# Patient Record
Sex: Male | Born: 1995
Health system: Southern US, Community
[De-identification: ages and names within clinical notes are randomized; demographics above are authoritative.]

## PROBLEM LIST (undated history)

## (undated) DIAGNOSIS — R569 Unspecified convulsions: Secondary | ICD-10-CM

## (undated) HISTORY — DX: Unspecified convulsions: R56.9

---
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
## 2016-07-24 DIAGNOSIS — G40909 Epilepsy, unspecified, not intractable, without status epilepticus: Secondary | ICD-10-CM

## 2016-07-24 HISTORY — DX: Epilepsy, unspecified, not intractable, without status epilepticus: G40.909

## 2017-01-21 ENCOUNTER — Emergency Department (HOSPITAL_COMMUNITY)
Admission: EM | Admit: 2017-01-21 | Discharge: 2017-01-21 | Disposition: A | Discharge status: Home / Self Care | Payer: BLUE CROSS/BLUE SHIELD | Attending: Emergency Medicine | Admitting: Emergency Medicine

## 2017-01-21 ENCOUNTER — Encounter (HOSPITAL_COMMUNITY): Payer: Self-pay | Admitting: Pharmacy Technician

## 2017-01-21 ENCOUNTER — Emergency Department (HOSPITAL_COMMUNITY): Payer: BLUE CROSS/BLUE SHIELD

## 2017-01-21 DIAGNOSIS — Y999 Unspecified external cause status: Secondary | ICD-10-CM | POA: Diagnosis not present

## 2017-01-21 DIAGNOSIS — S0031XA Abrasion of nose, initial encounter: Secondary | ICD-10-CM | POA: Diagnosis not present

## 2017-01-21 DIAGNOSIS — Y9289 Other specified places as the place of occurrence of the external cause: Secondary | ICD-10-CM | POA: Diagnosis not present

## 2017-01-21 DIAGNOSIS — S50312A Abrasion of left elbow, initial encounter: Secondary | ICD-10-CM | POA: Insufficient documentation

## 2017-01-21 DIAGNOSIS — R569 Unspecified convulsions: Secondary | ICD-10-CM | POA: Diagnosis not present

## 2017-01-21 DIAGNOSIS — W01198A Fall on same level from slipping, tripping and stumbling with subsequent striking against other object, initial encounter: Secondary | ICD-10-CM | POA: Insufficient documentation

## 2017-01-21 DIAGNOSIS — Y93G2 Activity, grilling and smoking food: Secondary | ICD-10-CM | POA: Insufficient documentation

## 2017-01-21 LAB — CBC WITH DIFFERENTIAL/PLATELET
BASOS ABS: 0 10*3/uL (ref 0.0–0.1)
Basophils Relative: 0 %
EOS PCT: 0 %
Eosinophils Absolute: 0 10*3/uL (ref 0.0–0.7)
HCT: 39.1 % (ref 39.0–52.0)
Hemoglobin: 13.7 g/dL (ref 13.0–17.0)
LYMPHS PCT: 9 %
Lymphs Abs: 1 10*3/uL (ref 0.7–4.0)
MCH: 31 pg (ref 26.0–34.0)
MCHC: 35 g/dL (ref 30.0–36.0)
MCV: 88.5 fL (ref 78.0–100.0)
MONO ABS: 0.8 10*3/uL (ref 0.1–1.0)
Monocytes Relative: 7 %
Neutro Abs: 9.9 10*3/uL — ABNORMAL HIGH (ref 1.7–7.7)
Neutrophils Relative %: 84 %
PLATELETS: 220 10*3/uL (ref 150–400)
RBC: 4.42 MIL/uL (ref 4.22–5.81)
RDW: 12.1 % (ref 11.5–15.5)
WBC: 11.7 10*3/uL — ABNORMAL HIGH (ref 4.0–10.5)

## 2017-01-21 LAB — BASIC METABOLIC PANEL
Anion gap: 6 (ref 5–15)
BUN: 18 mg/dL (ref 6–20)
CALCIUM: 8.8 mg/dL — AB (ref 8.9–10.3)
CO2: 26 mmol/L (ref 22–32)
CREATININE: 1.06 mg/dL (ref 0.61–1.24)
Chloride: 106 mmol/L (ref 101–111)
GFR calc Af Amer: >60 mL/min (ref >60)
GLUCOSE: 117 mg/dL — AB (ref 65–99)
POTASSIUM: 3.7 mmol/L (ref 3.5–5.1)
Sodium: 138 mmol/L (ref 135–145)

## 2017-01-21 LAB — CBG MONITORING, ED: GLUCOSE-CAPILLARY: 107 mg/dL — AB (ref 65–99)

## 2017-01-21 MED ORDER — LORAZEPAM 2 MG/ML IJ SOLN
1.0000 mg | Freq: Once | INTRAMUSCULAR | Status: DC | PRN
  Filled 2017-01-21: qty 1

## 2017-01-21 NOTE — ED Notes (Signed)
Family at bedside. 

## 2017-01-21 NOTE — ED Notes (Signed)
Pt with 1 episode of emesis 

## 2017-01-21 NOTE — ED Triage Notes (Signed)
Pt arrives via EMS with reports of grand mal seizure, witnessed, lasting approx 2 minutes. Pt fell from a standing position and hit his nose on a grill. Small abrasion noted to bridge of nose. Pt a&oX4 on arrival. Pt given 4mg  odt zofran and 400cc NS en route. Pt with hx of 1 seizure approx 8 years ago. Pt arrives in [place].

## 2017-01-21 NOTE — ED Provider Notes (Signed)
[facility]-EMERGENCY DEPT Provider Note   CSN: [id number] Arrival date & time: 01/21/17  1531     History   Chief Complaint Chief Complaint  Patient presents with  . Seizures    HPI Matthew Barr is a 21 y.o. 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 Bystanders reported that he suddenly dropped and hit his head on the grill fell to the grown and was tightening his extremities. Family reports that it took a while for him to come back completely to it and he was pale. He had an episode of vomiting. They report confusion for a while after the episode. Patient states that she has had a seizure about 8 years ago and workup was negative. At the time he was staring at a flashing screen 1 inch from his eyes and suddenly went into a seizure tongue biting which was witnessed. Aunt in the room reports a family history of early sudden death in great-grandfather who died suddenly after returning from [employer] and was in his 15s. He denies any recent head trauma, falls, injuries, fever, infection. He has been well up until this episode.  HPI  History reviewed. No pertinent past medical history.  There are no active problems to display for this patient.   History reviewed. No pertinent surgical history.     Home Medications    Prior to Admission medications   Not on File    Family History No family history on file.  Social History Social History  Substance Use Topics  . Smoking status: Not on file  . Smokeless tobacco: Not on file  . Alcohol use Not on file     Allergies   Patient has no allergy information on record.   Review of Systems Review of Systems  Constitutional: Negative for chills and fever.  HENT: Negative for ear pain, facial swelling, sore throat, trouble swallowing and voice change.   Eyes: Negative for pain and visual disturbance.  Respiratory: Negative for cough, shortness of breath, wheezing and stridor.   Cardiovascular: Negative  for chest pain and palpitations.  Gastrointestinal: Negative for abdominal pain and vomiting.  Genitourinary: Negative for dysuria and hematuria.  Musculoskeletal: Negative for arthralgias, back pain, gait problem, joint swelling, myalgias, neck pain and neck stiffness.  Skin: Positive for wound. Negative for color change, pallor and rash.  Neurological: Positive for seizures. Negative for dizziness, facial asymmetry, speech difficulty, weakness, light-headedness, numbness and headaches.     Physical Exam Updated Vital Signs BP 104/67   Pulse 89   Temp 98.3 F (36.8 C) (Oral)   Resp 12   SpO2 98%   Physical Exam  Constitutional: He is oriented to person, place, and time. He appears well-developed and well-nourished. No distress.  Afebrile, nontoxic-appearing, lying comfortably in bed in no acute distress.  HENT:  Head: Normocephalic and atraumatic.  Mouth/Throat: Oropharynx is clear and moist. No oropharyngeal exudate.  Patient with evidence of tongue biting on the left small bleeding area  Eyes: Conjunctivae and EOM are normal. Pupils are equal, round, and reactive to light.  Neck: Normal range of motion. Neck supple.  Cardiovascular: Normal rate, regular rhythm, normal heart sounds and intact distal pulses.   No murmur heard. Pulmonary/Chest: Effort normal and breath sounds normal. No stridor. No respiratory distress. He has no wheezes. He has no rales. He exhibits no tenderness.  Abdominal: Soft. He exhibits no distension and no mass. There is no tenderness. There is no rebound and no guarding.  Musculoskeletal:  Normal range of motion. He exhibits tenderness. He exhibits no edema or deformity.  Tenderness palpation of the nasal bone and supraorbital area more on the right than left.  Neurological: He is alert and oriented to person, place, and time. No cranial nerve deficit or sensory deficit. He exhibits normal muscle tone. Coordination normal.  Neurologic Exam:  - Mental  status: Patient is alert and cooperative. Fluent speech and words are clear. Coherent thought processes and insight is good. Patient is oriented x 4 to person, place, time and event.  - Cranial nerves:  CN III, IV, VI: pupils equally round, reactive to light both direct and conscensual and normal accommodation. Full extra-ocular movement. CN V: motor temporalis and masseter strength intact. CN VII : muscles of facial expression intact. CN X :  midline uvula. XI strength of sternocleidomastoid and trapezius muscles 5/5, XII: tongue is midline when protruded. - Motor: No involuntary movements. Muscle tone and bulk normal throughout. Muscle strength is 5/5 in bilateral shoulder abduction, elbow flexion and extension, grip, hip extension, flexion, leg flexion and extension, ankle dorsiflexion and plantar flexion.  - Sensory: Proprioception, light tough sensation intact in all extremities.  - Cerebellar: rapid alternating movements and point to point movement intact in upper and lower extremities. Normal stance and gait.  Skin: Skin is warm. No rash noted. He is not diaphoretic. No erythema. No pallor.  Patient with abrasion to the left elbow and small linear abrasion to the bridge of the nose.  Psychiatric: He has a normal mood and affect.  Nursing note and vitals reviewed.    ED Treatments / Results  Labs (all labs ordered are listed, but only abnormal results are displayed) Labs Reviewed  BASIC METABOLIC PANEL - Abnormal; Notable for the following:       Result Value   Glucose, Bld 117 (*)    Calcium 8.8 (*)    All other components within normal limits  CBC WITH DIFFERENTIAL/PLATELET - Abnormal; Notable for the following:    WBC 11.7 (*)    Neutro Abs 9.9 (*)    All other components within normal limits  CBG MONITORING, ED - Abnormal; Notable for the following:    Glucose-Capillary 107 (*)    All other components within normal limits    EKG  EKG Interpretation  Date/Time:  Sunday January 21 2017 16:25:22 EDT Ventricular Rate:  88 PR Interval:    QRS Duration: 96 QT Interval:  345 QTC Calculation: 418 R Axis:   85 Text Interpretation:  Sinus rhythm No significant change since last tracing Confirmed by Doug Sou [phone]) on 01/21/2017 4:28:58 PM       Radiology Ct Head Wo Contrast  Result Date: 01/21/2017 CLINICAL DATA:  21 year old male with fall at work, face and head injury on grill. Possible seizure. EXAM: CT HEAD WITHOUT CONTRAST CT MAXILLOFACIAL WITHOUT CONTRAST TECHNIQUE: Multidetector CT imaging of the head and maxillofacial structures were performed using the standard protocol without intravenous contrast. Multiplanar CT image reconstructions of the maxillofacial structures were also generated. COMPARISON:  Head CT without contrast 09/04/2009 FINDINGS: CT HEAD FINDINGS Brain: Stable and normal cerebral volume. No midline shift, ventriculomegaly, mass effect, evidence of mass lesion, intracranial hemorrhage or evidence of cortically based acute infarction. Gray-white matter differentiation is within normal limits throughout the brain. Vascular: No suspicious intracranial vascular hyperdensity. Skull: Calvarium and skullbase appears stable and intact. Other: No scalp hematoma or subcutaneous gas identified. CT MAXILLOFACIAL FINDINGS Osseous: Mandible intact. Maxilla intact. No zygoma fracture. Central  skullbase intact. Visible cervical spine appears intact. Orbits: Medial right orbit and superior bridge of nose soft tissue swelling (series 7, image 64). No subcutaneous gas. No radiopaque foreign body identified. Both globes appear intact. The intraorbital soft tissues are normal. Bilateral orbital walls are intact. No definite nasal bone fracture. Sinuses: Clear.  Tympanic cavities and mastoids are clear. Soft tissues: Visible noncontrast larynx, pharynx, parapharyngeal spaces, retropharyngeal space, sublingual space, submandibular glands and parotid glands are within normal  limits. IMPRESSION: 1. Medial right orbit/bridge of nose soft tissue injury without underlying fracture. 2. No face or skull fracture identified. 3. Stable and normal noncontrast CT appearance of the brain. Electronically Signed   By: Odessa FlemingH  Hall M.D.   On: 01/21/2017 17:58   Ct Maxillofacial Wo Contrast  Result Date: 01/21/2017 CLINICAL DATA:  21 year old male with fall at work, face and head injury on grill. Possible seizure. EXAM: CT HEAD WITHOUT CONTRAST CT MAXILLOFACIAL WITHOUT CONTRAST TECHNIQUE: Multidetector CT imaging of the head and maxillofacial structures were performed using the standard protocol without intravenous contrast. Multiplanar CT image reconstructions of the maxillofacial structures were also generated. COMPARISON:  Head CT without contrast 09/04/2009 FINDINGS: CT HEAD FINDINGS Brain: Stable and normal cerebral volume. No midline shift, ventriculomegaly, mass effect, evidence of mass lesion, intracranial hemorrhage or evidence of cortically based acute infarction. Gray-white matter differentiation is within normal limits throughout the brain. Vascular: No suspicious intracranial vascular hyperdensity. Skull: Calvarium and skullbase appears stable and intact. Other: No scalp hematoma or subcutaneous gas identified. CT MAXILLOFACIAL FINDINGS Osseous: Mandible intact. Maxilla intact. No zygoma fracture. Central skullbase intact. Visible cervical spine appears intact. Orbits: Medial right orbit and superior bridge of nose soft tissue swelling (series 7, image 64). No subcutaneous gas. No radiopaque foreign body identified. Both globes appear intact. The intraorbital soft tissues are normal. Bilateral orbital walls are intact. No definite nasal bone fracture. Sinuses: Clear.  Tympanic cavities and mastoids are clear. Soft tissues: Visible noncontrast larynx, pharynx, parapharyngeal spaces, retropharyngeal space, sublingual space, submandibular glands and parotid glands are within normal limits.  IMPRESSION: 1. Medial right orbit/bridge of nose soft tissue injury without underlying fracture. 2. No face or skull fracture identified. 3. Stable and normal noncontrast CT appearance of the brain. Electronically Signed   By: Odessa FlemingH  Hall M.D.   On: 01/21/2017 17:58    Procedures Procedures (including critical care time)  Medications Ordered in ED Medications  LORazepam (ATIVAN) injection 1 mg (not administered)     Initial Impression / Assessment and Plan / ED Course  I have reviewed the triage vital signs and the nursing notes.  Pertinent labs & imaging results that were available during my care of the patient were reviewed by me and considered in my medical decision making (see chart for details).    Patient presented with witnessed seizure PTA. Otherwise healthy. Reassuring exam, normal neuro.  Labs unremarkable Imaging negative, EKG normal. Patient was observed for hours in the ED with improvement and no symptoms.  Patient is able to ambulate without difficulties.  Discharge home with neurology follow-up. Advised patient to remain well-hydrated and avoid extreme heat for the next 48 hours.  Patient was well-appearing and stable prior to discharge. Urged patient to establish care with a primary care provider.  Discussed strict return precautions and advised to return to the emergency department if experiencing any new or worsening symptoms. Instructions were understood and patient agreed with discharge plan.  Final Clinical Impressions(s) / ED Diagnoses   Final diagnoses:  Seizure (  [facility])    New Prescriptions New Prescriptions   No medications on file     Gregary Cromer 01/21/17 2136    Doug Sou, MD 01/22/17 [phone]

## 2017-01-21 NOTE — Discharge Instructions (Signed)
As discussed, your imaging was negative for acute injury or abnormality of the brain and facial bones. No electrolyte abnormalities and your neuro exam was reassuring. Please follow-up with Neurology outpatient.  Stay well hydrated drinking enough fluids to keep your urine clear. Follow-up with a primary care provider.  Return to the emergency department if you experience any new concerning symptoms in the meantime.

## 2017-06-02 ENCOUNTER — Emergency Department (HOSPITAL_COMMUNITY): Payer: BLUE CROSS/BLUE SHIELD

## 2017-06-02 ENCOUNTER — Emergency Department (HOSPITAL_COMMUNITY)
Admission: EM | Admit: 2017-06-02 | Discharge: 2017-06-02 | Disposition: A | Discharge status: Home / Self Care | Payer: BLUE CROSS/BLUE SHIELD | Attending: Emergency Medicine | Admitting: Emergency Medicine

## 2017-06-02 ENCOUNTER — Other Ambulatory Visit: Payer: Self-pay

## 2017-06-02 DIAGNOSIS — W01198A Fall on same level from slipping, tripping and stumbling with subsequent striking against other object, initial encounter: Secondary | ICD-10-CM | POA: Diagnosis not present

## 2017-06-02 DIAGNOSIS — Y929 Unspecified place or not applicable: Secondary | ICD-10-CM | POA: Insufficient documentation

## 2017-06-02 DIAGNOSIS — R112 Nausea with vomiting, unspecified: Secondary | ICD-10-CM | POA: Diagnosis not present

## 2017-06-02 DIAGNOSIS — R569 Unspecified convulsions: Secondary | ICD-10-CM

## 2017-06-02 DIAGNOSIS — S0101XA Laceration without foreign body of scalp, initial encounter: Secondary | ICD-10-CM | POA: Insufficient documentation

## 2017-06-02 DIAGNOSIS — Y998 Other external cause status: Secondary | ICD-10-CM | POA: Insufficient documentation

## 2017-06-02 DIAGNOSIS — Y9389 Activity, other specified: Secondary | ICD-10-CM | POA: Insufficient documentation

## 2017-06-02 LAB — CBC
HEMATOCRIT: 42.9 % (ref 39.0–52.0)
Hemoglobin: 14.8 g/dL (ref 13.0–17.0)
MCH: 30.5 pg (ref 26.0–34.0)
MCHC: 34.5 g/dL (ref 30.0–36.0)
MCV: 88.3 fL (ref 78.0–100.0)
Platelets: 262 10*3/uL (ref 150–400)
RBC: 4.86 MIL/uL (ref 4.22–5.81)
RDW: 11.9 % (ref 11.5–15.5)
WBC: 12.8 10*3/uL — AB (ref 4.0–10.5)

## 2017-06-02 LAB — BASIC METABOLIC PANEL
ANION GAP: 10 (ref 5–15)
BUN: 13 mg/dL (ref 6–20)
CHLORIDE: 104 mmol/L (ref 101–111)
CO2: 25 mmol/L (ref 22–32)
Calcium: 8.9 mg/dL (ref 8.9–10.3)
Creatinine, Ser: 1.1 mg/dL (ref 0.61–1.24)
GFR calc Af Amer: >60 mL/min (ref >60)
GFR calc non Af Amer: >60 mL/min (ref >60)
GLUCOSE: 132 mg/dL — AB (ref 65–99)
POTASSIUM: 3.6 mmol/L (ref 3.5–5.1)
Sodium: 139 mmol/L (ref 135–145)

## 2017-06-02 MED ORDER — ONDANSETRON HCL 4 MG/2ML IJ SOLN
4.0000 mg | Freq: Once | INTRAMUSCULAR | Status: AC
  Administered 2017-06-02: 4 mg via INTRAVENOUS
  Filled 2017-06-02: qty 2

## 2017-06-02 MED ORDER — LIDOCAINE-EPINEPHRINE (PF) 2 %-1:200000 IJ SOLN
10.0000 mL | Freq: Once | INTRAMUSCULAR | Status: AC
  Administered 2017-06-02: 10 mL
  Filled 2017-06-02: qty 20

## 2017-06-02 NOTE — Discharge Instructions (Addendum)
Please read and follow all provided instructions.  Your diagnoses today include:  1. Seizure (HCC)   2. Laceration of scalp, initial encounter      Home care instructions:  Follow any educational materials and wound care instructions contained in this packet.   You may shower and wash the area with soap and water, just be sure to pat the area dry and not rub over the stitches. Do no put your stiches underwater (in a bath, pool, or lake). Getting stiches wet can slow down healing and increase your chances of getting an infection. You may apply Bacitracin or Neosporin twice a day for 7 days, and keep the ara clean with  bandage or gauze. Do not apply alcohol or hydrogen peroxide. Cover the area if it draining or weeping.   Follow-up instructions: Suture Removal: Return to the Emergency Department or see your primary care care doctor in 5-8 days for a recheck of your wound and removal of your sutures or staples.    Seizure Precautions: It is not legal to drive within six months of a disabiling seziure, and you are responsible for updating the [facility] [place] of Public Safety on your condition. Swimming alone or bathing in a tub can lead to drowning if a seizure were to occur. Precautions should be taken with hot liquids (e.g. Boiling water, cooking oil), we suggest keeping water heaters below 120 Fahrenheit. Caution should also be taken with appliances such as blow dryers, power tools, lawn mowers, etc. That could be dangerous in the event of a seizure. Avoid activities that require being alert such as operating equipment (like an airplane or forklift, driving a vehicle, swimming, bathing, climbing, or other activities during which having a seizure would put you or someone else in danger. This applies to hobbies or sports as well. Since falls are a common problem for seizure patients, please evaluate your home for sharp objects or corners that could be dangerous un such an event.    Return  instructions:  Return to the Emergency Department if you have: Fever Worsening pain Worsening swelling of the wound Pus draining from the wound Redness of the skin that moves away from the wound, especially if it streaks away from the affected area  Any other emergent concerns  Your vital signs today were: BP 120/74 (BP Location: Right Arm)    Pulse 90    Temp (!) 97.5 F (36.4 C) (Oral)    Resp 16    Wt 68.9 kg (152 lb)    SpO2 96%  If your blood pressure (BP) was elevated above 135/85 this visit, please have this repeated by your doctor within one month. --------------  You were diagnosed with a seizure today.   Seizure Precautions: It is not legal to drive within six months of a disabiling seziure, and you are responsible for updating the [facility] of Public Safety on your condition. Swimming alone or bathing in a tub can lead to drowning if a seizure were to occur. Precautions should be taken with hot liquids (e.g. Boiling water, cooking oil), we suggest keeping water heaters below 120 Fahrenheit. Caution should also be taken with appliances such as blow dryers, power tools, lawn mowers, etc. That could be dangerous in the event of a seizure. Avoid activities that require being alert such as operating equipment (like an airplane or forklift, driving a vehicle, swimming, bathing, climbing, or other activities during which having a seizure would put you or someone else in danger. This  applies to hobbies or sports as well. Since falls are a common problem for seizure patients, please evaluate your home for sharp objects or corners that could be dangerous un such an event.

## 2017-06-02 NOTE — ED Triage Notes (Signed)
EMS reports pt had seizure at work, witnessed, post ictal 10-15 min. Pt did fall and hit back of head laceration to back of head, bleeding controlled. C-collar on for safety of c spine

## 2017-06-02 NOTE — ED Provider Notes (Signed)
[facility]-EMERGENCY DEPT Provider Note   CSN: [id number] Arrival date & time: 06/02/17  1416     History   Chief Complaint Chief Complaint  Patient presents with  . Seizures   HPI Matthew Barr is a 21 y.o. male.  HPI  21 y.o. male, presents to the Emergency Department today due to witnessed seizures. This is the second seizure since 01-21-17. Pt did not follow up with outpatient neurology. The witnessed seizure. Patient or EMS unsure of duration. Pt was post-ictal on arrival with duration 10-15min. Described aura prior to event that lasted 1-2 minutes. Noted head trauma with laceration to right anterior scalp. Bleeding controlled. Denies headache. Does endorse nausea with x 2 episodes emesis since head trauma. No numbness/tingling. No CP/SOB/ABD pain. Pt denies symptoms currently. No other symptoms noted    No past medical history on file.  There are no active problems to display for this patient.  No past surgical history on file.   Home Medications    Prior to Admission medications   Not on File    Family History No family history on file.  Social History Social History   Tobacco Use  . Smoking status: Not on file  Substance Use Topics  . Alcohol use: Not on file  . Drug use: Not on file   Allergies   Patient has no known allergies.  Review of Systems Review of Systems ROS reviewed and all are negative for acute change except as noted in the HPI.  Physical Exam Updated Vital Signs BP 111/67 (BP Location: Right Arm)   Pulse 85   Temp (!) 97.5 F (36.4 C) (Oral)   Resp 18   Wt 68.9 kg (152 lb)   SpO2 100%   Physical Exam  Constitutional: Vital signs are normal. He appears well-developed and well-nourished. No distress.  HENT:  Head: Normocephalic and atraumatic. Head is without raccoon's eyes and without Battle's sign.  Right Ear: No hemotympanum.  Left Ear: No hemotympanum.  Nose: Nose normal.  Mouth/Throat: Uvula is  midline, oropharynx is clear and moist and mucous membranes are normal.  Right parietal scalp laceration 1-2cm. Bleeding controlled. Wound is well approximated and shallow.   Eyes: EOM are normal. Pupils are equal, round, and reactive to light.  Neck: Trachea normal and normal range of motion. Neck supple. No spinous process tenderness and no muscular tenderness present. No tracheal deviation and normal range of motion present.  Cardiovascular: Normal rate, regular rhythm, S1 normal, S2 normal, normal heart sounds, intact distal pulses and normal pulses.  Pulmonary/Chest: Effort normal and breath sounds normal. No respiratory distress. He has no decreased breath sounds. He has no wheezes. He has no rhonchi. He has no rales.  Abdominal: Normal appearance and bowel sounds are normal. There is no tenderness. There is no rigidity and no guarding.  Musculoskeletal: Normal range of motion.  Neurological: He is alert. He has normal strength. No cranial nerve deficit or sensory deficit.  Cranial Nerves:  II: Pupils equal, round, reactive to light III,IV, VI: ptosis not present, extra-ocular motions intact bilaterally  V,VII: smile symmetric, facial light touch sensation equal VIII: hearing grossly normal bilaterally  IX,X: midline uvula rise  XI: bilateral shoulder shrug equal and strong XII: midline tongue extension  Skin: Skin is warm and dry.  Psychiatric: He has a normal mood and affect. His speech is normal and behavior is normal.  Nursing note and vitals reviewed.  ED Treatments / Results  Labs (all labs  ordered are listed, but only abnormal results are displayed) Labs Reviewed  CBC - Abnormal; Notable for the following components:      Result Value   WBC 12.8 (*)    All other components within normal limits  BASIC METABOLIC PANEL - Abnormal; Notable for the following components:   Glucose, Bld 132 (*)    All other components within normal limits  RAPID URINE DRUG SCREEN, HOSP  PERFORMED  CBG MONITORING, ED    EKG  EKG Interpretation None       Radiology Ct Head Wo Contrast  Result Date: 06/02/2017 CLINICAL DATA:  Post seizure with fall and trauma to the posterior head. EXAM: CT HEAD WITHOUT CONTRAST TECHNIQUE: Contiguous axial images were obtained from the base of the skull through the vertex without intravenous contrast. COMPARISON:  01/21/2017 FINDINGS: Brain: No evidence of acute infarction, hemorrhage, hydrocephalus, extra-axial collection or mass lesion/mass effect. Vascular: No hyperdense vessel or unexpected calcification. Skull: Normal. Negative for fracture or focal lesion. Sinuses/Orbits: No acute finding. Other: None. IMPRESSION: No acute intracranial abnormality. Electronically Signed   By: Ted Mcalpineobrinka  Dimitrova M.D.   On: 06/02/2017 15:37    Procedures .Marland Kitchen.Laceration Repair Date/Time: 06/02/2017 4:07 PM Performed by: Audry PiliMohr, Terry Bolotin, PA-C Authorized by: Audry PiliMohr, Lener Ventresca, PA-C   Consent:    Consent obtained:  Verbal   Consent given by:  Patient   Risks discussed:  Infection, pain, poor cosmetic result, poor wound healing, nerve damage and need for additional repair   Alternatives discussed:  No treatment Anesthesia (see MAR for exact dosages):    Anesthesia method:  Local infiltration   Local anesthetic:  Lidocaine 1% WITH epi Laceration details:    Location:  Scalp   Scalp location:  R parietal   Length (cm):  2 Repair type:    Repair type:  Simple Pre-procedure details:    Preparation:  Imaging obtained to evaluate for foreign bodies Exploration:    Hemostasis achieved with:  Direct pressure   Wound exploration: entire depth of wound probed and visualized   Treatment:    Area cleansed with:  Hibiclens, saline and Betadine   Amount of cleaning:  Standard   Irrigation solution:  Sterile saline   Irrigation method:  Pressure wash and syringe   Visualized foreign bodies/material removed: yes   Skin repair:    Repair method:  Sutures   Suture  size:  4-0   Suture material:  Prolene   Suture technique:  Simple interrupted   Number of sutures:  2 Approximation:    Approximation:  Close   Vermilion border: well-aligned   Post-procedure details:    Dressing:  Antibiotic ointment   Patient tolerance of procedure:  Tolerated well, no immediate complications   (including critical care time)  Medications Ordered in ED Medications  ondansetron (ZOFRAN) injection 4 mg (4 mg Intravenous Given 06/02/17 1546)  lidocaine-EPINEPHrine (XYLOCAINE W/EPI) 2 %-1:200000 (PF) injection 10 mL (10 mLs Infiltration Given 06/02/17 1600)   Initial Impression / Assessment and Plan / ED Course  I have reviewed the triage vital signs and the nursing notes.  Pertinent labs & imaging results that were available during my care of the patient were reviewed by me and considered in my medical decision making (see chart for details).  Final Clinical Impressions(s) / ED Diagnoses  {I have reviewed and evaluated the relevant laboratory values. {I have reviewed and evaluated the relevant imaging studies.  {I have reviewed the relevant previous healthcare records.  {I obtained HPI from historian.  ED Course:  Assessment: Pt is a 21 y.o. male presents to the Emergency Department today due to witnessed seizures. This is the second seizure since 01-21-17. Pt did not follow up with outpatient neurology. The witnessed seizure. Pt or EMS unsure of duration. Pt was post-ictal on arrival with duration 10-15min. Noted head trauma with laceration to right anterior scalp. Bleeding controlled. Denies headache. Does endorse nausea with x 2 episodes emesis since head trauma. No numbness/tingling. No CP/SOB/ABD pain. Pt denies symptoms currently. On exam, pt in NAD. Nontoxic/nonseptic appearing. VSS. Afebrile. Lungs CTA. Heart RRR. Abdomen nontender soft. Post ictal on arrival. No neuro deficits noted. Right parietal scalp laceration 1-2cm. Bleeding controlled. Wound is well  approximated and shallow. Pt with N/V x 2 post head trauma. Obtained CT Head which was unremarkable.  CBC unremarkable. BMP unremarkable. Given anti emetics in ED. Tetanus UTD. Laceration repaired with TWO 4-0 prolene. Ambulatory referral to Neuro. Observed in ED without subsequent seizure. Plan is to DC home with follow up to Neurology. At time of discharge, Patient is in no acute distress. Vital Signs are stable. Patient is able to ambulate. Patient able to tolerate PO.   Disposition/Plan:  DC Home Additional Verbal discharge instructions given and discussed with patient.  Pt Instructed to f/u with Neurology in the next week for evaluation and treatment of symptoms. Return precautions given Pt acknowledges and agrees with plan  Supervising Physician Arby BarrettePfeiffer, Marcy, MD  Final diagnoses:  Seizure [facility](HCC)  Laceration of scalp, initial encounter    ED Discharge Orders    None       Audry PiliMohr, Zikeria Keough, PA-C 06/02/17 1710    Arby BarrettePfeiffer, Marcy, MD 06/03/17 [id number]

## 2017-06-04 ENCOUNTER — Encounter: Payer: Self-pay | Admitting: Neurology

## 2017-06-13 ENCOUNTER — Encounter: Payer: Self-pay | Admitting: Neurology

## 2017-06-13 ENCOUNTER — Ambulatory Visit (INDEPENDENT_AMBULATORY_CARE_PROVIDER_SITE_OTHER): Payer: BLUE CROSS/BLUE SHIELD | Admitting: Neurology

## 2017-06-13 VITALS — BP 106/70 | HR 90 | Ht 68.0 in | Wt 144.8 lb

## 2017-06-13 DIAGNOSIS — G40909 Epilepsy, unspecified, not intractable, without status epilepticus: Secondary | ICD-10-CM | POA: Diagnosis not present

## 2017-06-13 MED ORDER — LEVETIRACETAM 500 MG PO TABS: 500.0000 mg | ORAL_TABLET | Freq: Two times a day (BID) | ORAL | 0 refills | Status: DC

## 2017-06-13 MED ORDER — LAMOTRIGINE 25 MG PO TABS: ORAL_TABLET | ORAL | 0 refills | Status: DC

## 2017-06-13 NOTE — Progress Notes (Addendum)
NEUROLOGY CONSULTATION NOTE  Matthew Barr MRN: [id number] DOB: 1995-11-19  Referring provider: ED referral Primary care provider: no PCP  Reason for consult:  seizures  HISTORY OF PRESENT ILLNESS: Matthew Barr is a 21 year old right-handed male with no past medical history who presents for seizures.  He is accompanied by his mother who supplements history regarding his first seizure.  Recent history supplemented by ED records.  On 09/04/09, he had a new onset seizure.  He was on his bed when he blacked out rolling over onto the floor.  His mother found him on the floor convulsing with foam from his mouth but no tongue biting or incontinence.  It lasted 60 to 90 seconds.  He was postictal afterward.  CT of head was unremarkable.  He did not follow up with neurology.  On 01/21/17, he was outside grilling when he suddenly dropped to the ground, hitting his head on the grill.  He exhibited extensor posturing and shaking.  It lasted about a couple of minutes.  When he woke up, he was pale and vomited.  He was confused and had a headache.  He presented to the ED for further evaluation.  CT of head was unremarkable.  CBC, BMP and EKG were normal.  He was discharged with instructions to follow up with neurology.   He never followed up with neurology.  On 06/02/17, he had a second witnessed seizure.  It was preceded by an undescribable aura for a few seconds prior to passing out.  He was postictal for about 10 to 15 minutes.  Afterwards, he was nauseous with 2 episodes of vomiting.  He returned to the ED where repeat head CT was unremarkable.  CBC and BMP were unremarkable.  He was not using drugs or feeling sick.  He has not had any recurrent seizure.  He is not driving.  He was delivered via C-section because he was breech, but otherwise uncomplicated birth.  He had no history of febrile seizures, meningitis/encephalitis, head trauma. There is no known family history of seizures.  PAST MEDICAL  HISTORY: History reviewed. No pertinent past medical history.  PAST SURGICAL HISTORY: History reviewed. No pertinent surgical history.  MEDICATIONS: No current outpatient medications on file prior to visit.   No current facility-administered medications on file prior to visit.     ALLERGIES: No Known Allergies  FAMILY HISTORY: History reviewed. No pertinent family history.  SOCIAL HISTORY: Social History   Socioeconomic History  . Marital status: Single    Spouse name: Not on file  . Number of children: 0  . Years of education: some college  . Highest education level: Some college, no degree  Social Needs  . Financial resource strain: Not on file  . Food insecurity - worry: Not on file  . Food insecurity - inability: Not on file  . Transportation needs - medical: Not on file  . Transportation needs - non-medical: Not on file  Occupational History  . Occupation: cahier  Tobacco Use  . Smoking status: Never Smoker  . Smokeless tobacco: Never Used  Substance and Sexual Activity  . Alcohol use: Yes    Comment: 2-3 beers a month  . Drug use: No  . Sexual activity: Not on file  Other Topics Concern  . Not on file  Social History Narrative   Single, lives with parents in 2 story home,, is student, pursueing communications degree. Has 3 dogs, two cats. Drinks 2 cups coffee a day. Exercises every  other day.    REVIEW OF SYSTEMS: Constitutional: No fevers, chills, or sweats, no generalized fatigue, change in appetite Eyes: No visual changes, double vision, eye pain Ear, nose and throat: No hearing loss, ear pain, nasal congestion, sore throat Cardiovascular: No chest pain, palpitations Respiratory:  No shortness of breath at rest or with exertion, wheezes GastrointestinaI: No nausea, vomiting, diarrhea, abdominal pain, fecal incontinence Genitourinary:  No dysuria, urinary retention or frequency Musculoskeletal:  No neck pain, back pain Integumentary: No rash,  pruritus, skin lesions Neurological: as above Psychiatric: No depression, insomnia, anxiety Endocrine: No palpitations, fatigue, diaphoresis, mood swings, change in appetite, change in weight, increased thirst Hematologic/Lymphatic:  No purpura, petechiae. Allergic/Immunologic: no itchy/runny eyes, nasal congestion, recent allergic reactions, rashes  PHYSICAL EXAM: Vitals:   06/13/17 0851  BP: 106/70  Pulse: 90  SpO2: 98%   General: No acute distress.  Patient appears well-groomed.  Head:  Normocephalic/atraumatic Eyes:  fundi examined but not visualized Neck: supple, no paraspinal tenderness, full range of motion Back: No paraspinal tenderness Heart: regular rate and rhythm Lungs: Clear to auscultation bilaterally. Vascular: No carotid bruits. Neurological Exam: Mental status: alert and oriented to person, place, and time, recent and remote memory intact, fund of knowledge intact, attention and concentration intact, speech fluent and not dysarthric, language intact. Cranial nerves: CN I: not tested CN II: OD 3 mm, OS 2 mm, round and reactive to light, visual fields intact CN III, IV, VI:  full range of motion, no nystagmus, no ptosis CN V: facial sensation intact CN VII: upper and lower face symmetric CN VIII: hearing intact CN IX, X: gag intact, uvula midline CN XI: sternocleidomastoid and trapezius muscles intact CN XII: tongue midline Bulk & Tone: normal, no fasciculations. Motor:  5/5 throughout  Sensation: temperature and vibration sensation intact. Deep Tendon Reflexes:  2+ throughout, toes downgoing.  Finger to nose testing:  Without dysmetria.  Heel to shin:  Without dysmetria.  Gait:  Normal station and stride.  Able to turn and tandem walk. Romberg negative.  IMPRESSION: Seizure disorder.    PLAN: 1.  We will start lamotrigine titration to goal of 50mg  twice daily.  2.  During titration, we will bridge with levetiracetam 500mg  twice daily. 3.  He will return  in 4 week, when he has reached goal dose of lamotrigine.  At that time, we will plan to discontinue levetiracetam. 4.  Explained [place] law stating no driving for 6 months from last seizure 5.  We will check MRI of brain with seizure protocol and sleep-deprived EEG   Shon MilletAdam Almir Botts, DO

## 2017-06-13 NOTE — Patient Instructions (Addendum)
1.  We will start levetiracetam (Keppra) 500mg  twice daily.  We will start lamotrigine (Lamictal).  We will titrate up the lamotrigine by the following schedule:    AM   PM Week 1&2    25mg  (1 tab) Week 3&4 25mg  (1 tab)  25mg  (1 tab) Week 5 50mg  (2 tabs)  50mg  (2 tabs)  If you develop a new or unusual rash, contact us immediately.  2. Avoid activities in which a seizure would cause danger to yourself or to others.  Don't operate dangerous machinery, swim alone, or climb in high or dangerous places, such as on ladders, roofs, or girders.  Do not drive unless your doctor says you may.  3. If you have any warning that you may have a seizure, lay down in a safe place where you can't hurt yourself.    4.  No driving for 6 months from last seizure, as per [place] law.   Please refer to the following link on the Epilepsy Foundation of America's website for more information: http://www.epilepsyfoundation.org/answerplace/Social/driving/drivingu.cfm   5.  Maintain good sleep hygiene.  6.  Notify your neurology if you are planning pregnancy or if you become pregnant.  7.  Contact your doctor if you have any problems that may be related to the medicine you are taking.  8.  Call 911 and bring the patient back to the ED if:        A.  The seizure lasts longer than 5 minutes.       B.  The patient doesn't awaken shortly after the seizure  C.  The patient has new problems such as difficulty seeing, speaking or moving  D.  The patient was injured during the seizure  E.  The patient has a temperature over 102 F (39C)  F.  The patient vomited and now is having trouble breathing       9.  We will check MRI of brain with seizure protocol 10.  We will check sleep deprived EEG 11.  Follow up in 5 weeks.  At that time, we will plan to discontinue Keppra.

## 2017-06-18 ENCOUNTER — Ambulatory Visit: Payer: BLUE CROSS/BLUE SHIELD | Admitting: Neurology

## 2017-06-18 ENCOUNTER — Ambulatory Visit (INDEPENDENT_AMBULATORY_CARE_PROVIDER_SITE_OTHER): Payer: BLUE CROSS/BLUE SHIELD | Admitting: Neurology

## 2017-06-18 ENCOUNTER — Encounter: Payer: Self-pay | Admitting: Neurology

## 2017-06-18 DIAGNOSIS — G40909 Epilepsy, unspecified, not intractable, without status epilepticus: Secondary | ICD-10-CM | POA: Diagnosis not present

## 2017-06-18 NOTE — Progress Notes (Signed)
ONE HOUR SLEEP-DEPRIVED ELECTROENCEPHALOGRAM REPORT  Date of Study: 06/18/2017  Patient's Name: Matthew Barr G Donofrio MRN: [id number] Date of Birth: 10-Oct-1995  Clinical History: 21 year old male with 2 seizures over the past 4 months.  He had a remote seizure when he was 21 years old.  Medications: None  Technical Summary: A multichannel digital EEG recording measured by the international 10-20 system with electrodes applied with paste and impedances below 5000 ohms performed in our laboratory with EKG monitoring in an awake and asleep patient.  Hyperventilation and photic stimulation were performed.  The digital EEG was referentially recorded, reformatted, and digitally filtered in a variety of bipolar and referential montages for optimal display.    Description: The patient is awake and asleep during the recording.  During maximal wakefulness, there is a symmetric, medium voltage 11 Hz posterior dominant rhythm that attenuates with eye opening.  The record is symmetric.  During drowsiness and sleep, there is an increase in theta slowing of the background.  Vertex waves and symmetric sleep spindles were seen.  Hyperventilation and photic stimulation did not elicit any abnormalities.  There were no epileptiform discharges or electrographic seizures seen.    EKG lead was unremarkable.  Impression: This awake and asleep one hour sleep-deprived EEG is normal.    Clinical Correlation: A normal EEG does not exclude a clinical diagnosis of epilepsy.  If further clinical questions remain, prolonged EEG may be helpful.  Clinical correlation is advised.   Shon MilletAdam Jaffe, DO

## 2017-06-19 ENCOUNTER — Telehealth: Payer: Self-pay

## 2017-06-19 NOTE — Telephone Encounter (Signed)
-----   Message from Drema DallasAdam R Jaffe, DO sent at 06/18/2017 10:13 AM EST ----- EEG is normal

## 2017-06-19 NOTE — Telephone Encounter (Signed)
Called Pt Lm on VM advising  EEG is normal.

## 2017-06-27 ENCOUNTER — Ambulatory Visit
Admission: RE | Admit: 2017-06-27 | Discharge: 2017-06-27 | Disposition: A | Discharge status: Home / Self Care | Payer: BLUE CROSS/BLUE SHIELD | Source: Ambulatory Visit | Attending: Neurology | Admitting: Neurology

## 2017-06-27 DIAGNOSIS — G40909 Epilepsy, unspecified, not intractable, without status epilepticus: Secondary | ICD-10-CM

## 2017-07-04 ENCOUNTER — Telehealth: Payer: Self-pay

## 2017-07-04 NOTE — Telephone Encounter (Signed)
Called Pt, LM on VM advising of normal MRI result

## 2017-07-04 NOTE — Telephone Encounter (Signed)
-----   Message from Drema DallasAdam R Jaffe, DO sent at 06/28/2017  6:57 AM EST ----- MRI of brain is normal

## 2017-07-10 ENCOUNTER — Ambulatory Visit (INDEPENDENT_AMBULATORY_CARE_PROVIDER_SITE_OTHER): Payer: BLUE CROSS/BLUE SHIELD | Admitting: Neurology

## 2017-07-10 ENCOUNTER — Encounter: Payer: Self-pay | Admitting: Neurology

## 2017-07-10 VITALS — BP 108/60 | HR 92 | Ht 68.0 in | Wt 145.4 lb

## 2017-07-10 DIAGNOSIS — G40909 Epilepsy, unspecified, not intractable, without status epilepticus: Secondary | ICD-10-CM

## 2017-07-10 MED ORDER — LAMOTRIGINE ER 100 MG PO TB24: 100.0000 mg | ORAL_TABLET | Freq: Every day | ORAL | 5 refills | Status: DC

## 2017-07-10 NOTE — Progress Notes (Signed)
NEUROLOGY FOLLOW UP OFFICE NOTE  Matthew Barr [id number]  HISTORY OF PRESENT ILLNESS: Matthew StackReece G Ploeger is a 21 year old right-handed male with no past medical history who follows up for seizures.  He is accompanied by his mother.  UPDATE: One hour sleep deprived EEG from 06/18/17 was normal. MRI of brain without contrast with seizure protocol from 06/27/17 was personally reviewed and was normal.  Last month, he was started on a lamotrigine titration and has now started 50mg  twice daily.  He is tolerating it well.  He denies dizziness, drowsiness and rash.  He has not had any recurrent seizures.  He has not been driving.   HISTORY: On 09/04/09, he had a new onset seizure.  He was on his bed when he blacked out rolling over onto the floor.  His mother found him on the floor convulsing with foam from his mouth but no tongue biting or incontinence.  It lasted 60 to 90 seconds.  He was postictal afterward.  CT of head was unremarkable.  He did not follow up with neurology.   On 01/21/17, he was outside grilling when he suddenly dropped to the ground, hitting his head on the grill.  He exhibited extensor posturing and shaking.  It lasted about a couple of minutes.  When he woke up, he was pale and vomited.  He was confused and had a headache.  He presented to the ED for further evaluation.  CT of head was unremarkable.  CBC, BMP and EKG were normal.  He was discharged with instructions to follow up with neurology.    He never followed up with neurology.  On 06/02/17, he had a second witnessed seizure.  It was preceded by an undescribable aura for a few seconds prior to passing out.  He was postictal for about 10 to 15 minutes.  Afterwards, he was nauseous with 2 episodes of vomiting.  He returned to the ED where repeat head CT was unremarkable.  CBC and BMP were unremarkable.   He was not using drugs or feeling sick.  He has not had any recurrent seizure.  He is not driving.   He was delivered via  C-section because he was breech, but otherwise uncomplicated birth.  He had no history of febrile seizures, meningitis/encephalitis, head trauma. His father may have had a questionable seizure while sick with the flu.  His maternal grandmother's uncle reportedly had epilepsy.  PAST MEDICAL HISTORY: History reviewed. No pertinent past medical history.  MEDICATIONS: No current outpatient medications on file prior to visit.   No current facility-administered medications on file prior to visit.     ALLERGIES: No Known Allergies  FAMILY HISTORY: Maternal grandmother's uncle:  epilepsy  SOCIAL HISTORY: Social History   Socioeconomic History  . Marital status: Single    Spouse name: Not on file  . Number of children: 0  . Years of education: some college  . Highest education level: Some college, no degree  Social Needs  . Financial resource strain: Not on file  . Food insecurity - worry: Not on file  . Food insecurity - inability: Not on file  . Transportation needs - medical: Not on file  . Transportation needs - non-medical: Not on file  Occupational History  . Occupation: Conservation officer, naturecashier  Tobacco Use  . Smoking status: Never Smoker  . Smokeless tobacco: Never Used  Substance and Sexual Activity  . Alcohol use: Yes    Comment: 2-3 beers a month  . Drug use:  No  . Sexual activity: Not on file  Other Topics Concern  . Not on file  Social History Narrative   Single, lives with parents in 2 story home,, is student, pursueing communications degree. Has 3 dogs, two cats. Drinks 2 cups coffee a day. Exercises every other day.    REVIEW OF SYSTEMS: Constitutional: No fevers, chills, or sweats, no generalized fatigue, change in appetite Eyes: No visual changes, double vision, eye pain Ear, nose and throat: No hearing loss, ear pain, nasal congestion, sore throat Cardiovascular: No chest pain, palpitations Respiratory:  No shortness of breath at rest or with exertion,  wheezes GastrointestinaI: No nausea, vomiting, diarrhea, abdominal pain, fecal incontinence Genitourinary:  No dysuria, urinary retention or frequency Musculoskeletal:  No neck pain, back pain Integumentary: No rash, pruritus, skin lesions Neurological: as above Psychiatric: No depression, insomnia, anxiety Endocrine: No palpitations, fatigue, diaphoresis, mood swings, change in appetite, change in weight, increased thirst Hematologic/Lymphatic:  No purpura, petechiae. Allergic/Immunologic: no itchy/runny eyes, nasal congestion, recent allergic reactions, rashes  PHYSICAL EXAM: Vitals:   07/10/17 1310  BP: 108/60  Pulse: 92  SpO2: 97%   General: No acute distress.  Patient appears well-groomed.  normal body habitus.  IMPRESSION: Seizure disorder  PLAN: 1.  Once he finishes the 50mg  twice daily of immediate release lamotrigine, he will start lamotrigine ER 100mg  daily. 2.  He may now discontinue Keppra. 3.  He will follow up in May on/after the 10th.  No driving until then.  15 minutes spent face to face with patient, 100% spent discussing diagnosis and management.  Shon MilletAdam Akari Crysler, DO

## 2017-07-10 NOTE — Patient Instructions (Signed)
1.  Continue lamotrigine 25mg , take 2 tablets twice daily.  When you finish, you may start the extended release (Lamictal XR/lamotrigine ER) 100mg  pill, once daily. 2.  You may stop levetiracetam (Keppra). 3.  Follow up in May after the 10th.  No driving until then.  1. If medication has been prescribed for you to prevent seizures, take it exactly as directed.  Do not stop taking the medicine without talking to your doctor first, even if you have not had a seizure in a long time.   2. Avoid activities in which a seizure would cause danger to yourself or to others.  Don't operate dangerous machinery, swim alone, or climb in high or dangerous places, such as on ladders, roofs, or girders.  Do not drive unless your doctor says you may.  3. If you have any warning that you may have a seizure, lay down in a safe place where you can't hurt yourself.    4.  No driving for 6 months from last seizure, [place].   Please refer to the following link on the [employer]ile[employer] of [employer]'s website for more information: http://www.epilepsyfoundation.org/answerplace/Social/driving/drivingu.cfm   5.  Maintain good sleep hygiene.  6.  Notify your neurology if you are planning pregnancy or if you become pregnant.  7.  Contact your doctor if you have any problems that may be related to the medicine you are taking.  8.  Call 911 and bring the patient back to the ED if:        A.  The seizure lasts longer than 5 minutes.       B.  The patient doesn't awaken shortly after the seizure  C.  The patient has new problems such as difficulty seeing, speaking or moving  D.  The patient was injured during the seizure  E.  The patient has a temperature over 102 F (39C)  F.  The patient vomited and now is having trouble breathing

## 2017-07-11 ENCOUNTER — Emergency Department (HOSPITAL_COMMUNITY)
Admission: EM | Admit: 2017-07-11 | Discharge: 2017-07-11 | Disposition: A | Discharge status: Home / Self Care | Payer: BLUE CROSS/BLUE SHIELD | Attending: Emergency Medicine | Admitting: Emergency Medicine

## 2017-07-11 ENCOUNTER — Emergency Department (HOSPITAL_COMMUNITY): Payer: BLUE CROSS/BLUE SHIELD

## 2017-07-11 ENCOUNTER — Telehealth: Payer: Self-pay | Admitting: Neurology

## 2017-07-11 ENCOUNTER — Encounter (HOSPITAL_COMMUNITY): Payer: Self-pay | Admitting: Emergency Medicine

## 2017-07-11 DIAGNOSIS — G40919 Epilepsy, unspecified, intractable, without status epilepticus: Secondary | ICD-10-CM | POA: Diagnosis not present

## 2017-07-11 DIAGNOSIS — R569 Unspecified convulsions: Secondary | ICD-10-CM | POA: Diagnosis present

## 2017-07-11 LAB — I-STAT CHEM 8, ED
BUN: 13 mg/dL (ref 6–20)
CALCIUM ION: 1.15 mmol/L (ref 1.15–1.40)
Chloride: 103 mmol/L (ref 101–111)
Creatinine, Ser: 1.2 mg/dL (ref 0.61–1.24)
Glucose, Bld: 94 mg/dL (ref 65–99)
HEMATOCRIT: 40 % (ref 39.0–52.0)
HEMOGLOBIN: 13.6 g/dL (ref 13.0–17.0)
Potassium: 4.2 mmol/L (ref 3.5–5.1)
SODIUM: 142 mmol/L (ref 135–145)
TCO2: 25 mmol/L (ref 22–32)

## 2017-07-11 MED ORDER — BACITRACIN ZINC 500 UNIT/GM EX OINT
TOPICAL_OINTMENT | Freq: Two times a day (BID) | CUTANEOUS | Status: DC
  Administered 2017-07-11: 19:00:00 via TOPICAL
  Filled 2017-07-11: qty 0.9

## 2017-07-11 MED ORDER — SODIUM CHLORIDE 0.9 % IV SOLN
1000.0000 mg | Freq: Once | INTRAVENOUS | Status: AC
  Administered 2017-07-11: 1000 mg via INTRAVENOUS
  Filled 2017-07-11: qty 10

## 2017-07-11 MED ORDER — SODIUM CHLORIDE 0.9 % IV BOLUS (SEPSIS)
1000.0000 mL | Freq: Once | INTRAVENOUS | Status: AC
  Administered 2017-07-11: 1000 mL via INTRAVENOUS

## 2017-07-11 MED ORDER — LEVETIRACETAM 500 MG PO TABS: 500.0000 mg | ORAL_TABLET | Freq: Two times a day (BID) | ORAL | 0 refills | Status: DC

## 2017-07-11 NOTE — ED Notes (Signed)
IV removed from left forearm.

## 2017-07-11 NOTE — ED Notes (Signed)
Neuro tele machine to bedside.

## 2017-07-11 NOTE — Telephone Encounter (Signed)
Called Pt, LM on VM 

## 2017-07-11 NOTE — ED Notes (Signed)
ED Provider at bedside. 

## 2017-07-11 NOTE — Telephone Encounter (Signed)
Mother called for patient. Patient had a Seizure today while at work. She wanted Dr.Jaffe to be aware. Please Call patient. Thanks

## 2017-07-11 NOTE — Discharge Instructions (Signed)
Continue taking your Lamictal as prescribed.  For your Keppra, you need to start taking 1 tablet twice a day, instead of once a day.  You should call your neurologist in follow-up in the next 1-2 weeks.

## 2017-07-11 NOTE — ED Triage Notes (Signed)
Per EMS, patient from work, where staff reports patient stated "I am going to have a seizure" twirled around three times and proceeded to have what was described as a grand mal seizure. Seen yesterday for same. Laceration to forehead. Bleeding controlled. N/V since seizure.   18 g L forearm  4mg  Zofran with EMS

## 2017-07-11 NOTE — ED Provider Notes (Signed)
[facility]-EMERGENCY DEPT Provider Note   CSN: [id number] Arrival date & time: 07/11/17  1523     History   Chief Complaint Chief Complaint  Patient presents with  . Seizures    HPI Matthew Barr is a 21 y.o. male.  HPI   21 year old male with history of primary seizure disorder with negative EEG and MRI, followed by [facility] Everlena [facility]Jaffe [facility], who presents with breakthrough seizure.  Of note, the patient was just seen yesterday per review of records, and was taken off of his Keppra.  He is on Lamictal 50 mg twice a day.  Patient states that he was at work today.  He denies any recent changes in his health.  He normally would have taken his Keppra dose prior to work, but states that is also not abnormal for him to wait until he takes it later in the afternoon.  He states that he began to feel acutely lightheaded with blurred vision.  This is his typical aura.  He told a coworker that he may have a seizure then reportedly had a generalized seizure.  This lasted less than 5 minutes.  He remembers coming back to baseline before EMS was there.  He reportedly fell forward and struck his head on the concrete.  He has a mild, generalized headache since then.  Denies any focal numbness or weakness.  He did not bite his tongue or lose bowel or bladder continence.  His last seizure was in November.  History reviewed. No pertinent past medical history.  There are no active problems to display for this patient.   History reviewed. No pertinent surgical history.     Home Medications    Prior to Admission medications   Medication Sig Start Date End Date Taking? Authorizing Provider  LamoTRIgine 50 MG TB24 24 hour tablet Take 50 mg by mouth daily. 07/10/17  Yes [provider]  LamoTRIgine (LAMICTAL XR) 100 MG TB24 24 hour tablet Take 1 tablet (100 mg total) by mouth daily. Patient not taking: Reported on 07/11/2017 07/10/17   Drema DallasJaffe, Adam R, DO    levETIRAcetam (KEPPRA) 500 MG tablet Take 1 tablet (500 mg total) by mouth 2 (two) times daily for 14 days. 07/11/17 07/25/17  Shaune PollackIsaacs, Danna Sewell, MD    Family History No family history on file.  Social History Social History   Tobacco Use  . Smoking status: Never Smoker  . Smokeless tobacco: Never Used  Substance Use Topics  . Alcohol use: Yes    Comment: 2-3 beers a month  . Drug use: No     Allergies   Patient has no known allergies.   Review of Systems Review of Systems  Constitutional: Negative for chills, fatigue and fever.  HENT: Negative for congestion and rhinorrhea.   Eyes: Negative for visual disturbance.  Respiratory: Negative for cough, shortness of breath and wheezing.   Cardiovascular: Negative for chest pain and leg swelling.  Gastrointestinal: Negative for abdominal pain, diarrhea, nausea and vomiting.  Genitourinary: Negative for dysuria and flank pain.  Musculoskeletal: Negative for neck pain and neck stiffness.  Skin: Negative for rash and wound.  Allergic/Immunologic: Negative for immunocompromised state.  Neurological: Positive for seizures and headaches. Negative for syncope and weakness.  All other systems reviewed and are negative.    Physical Exam Updated Vital Signs BP (!) 106/47   Pulse 69   Temp (!) 97.3 F (36.3 C) (Oral)   Resp 16   SpO2 98%  Physical Exam  Constitutional: He is oriented to person, place, and time. He appears well-developed and well-nourished. No distress.  HENT:  Head: Normocephalic and atraumatic.  Linear, superficial abrasion across the forehead onto the left upper eyelid.  There are no deep lacerations.  No bruising or deformity.  No step-offs.  Eyes: Conjunctivae are normal.  Neck: Neck supple.  Cardiovascular: Normal rate, regular rhythm and normal heart sounds. Exam reveals no friction rub.  No murmur heard. Pulmonary/Chest: Effort normal and breath sounds normal. No respiratory distress. He has no wheezes.  He has no rales.  Abdominal: He exhibits no distension.  Musculoskeletal: He exhibits no edema.  Neurological: He is alert and oriented to person, place, and time. He exhibits normal muscle tone.  Skin: Skin is warm. Capillary refill takes less than 2 seconds.  Psychiatric: He has a normal mood and affect.  Nursing note and vitals reviewed.   Neurological Exam:  Mental Status: Alert and oriented to person, place, and time. Attention and concentration normal. Speech clear. Recent memory is intact. Cranial Nerves: Visual fields grossly intact. EOMI and PERRLA. No nystagmus noted. Facial sensation intact at forehead, maxillary cheek, and chin/mandible bilaterally. No facial asymmetry or weakness. Hearing grossly normal. Uvula is midline, and palate elevates symmetrically. Normal SCM and trapezius strength. Tongue midline without fasciculations. Motor: Muscle strength 5/5 in proximal and distal UE and LE bilaterally. No pronator drift. Muscle tone normal. Reflexes: 2+ and symmetrical in all four extremities.  Sensation: Intact to light touch in upper and lower extremities distally bilaterally.  Gait: Normal without ataxia. Coordination: Normal FTN bilaterally.     ED Treatments / Results  Labs (all labs ordered are listed, but only abnormal results are displayed) Labs Reviewed  I-STAT CHEM 8, ED    EKG  EKG Interpretation None       Radiology Ct Head Wo Contrast  Result Date: 07/11/2017 CLINICAL DATA:  Headache, seizure EXAM: CT HEAD WITHOUT CONTRAST TECHNIQUE: Contiguous axial images were obtained from the base of the skull through the vertex without intravenous contrast. COMPARISON:  06/27/2017, 06/02/2017 FINDINGS: Brain: No evidence of acute infarction, hemorrhage, hydrocephalus, extra-axial collection or mass lesion/mass effect. Vascular: No hyperdense vessel or unexpected calcification. Skull: Normal. Negative for fracture or focal lesion. Sinuses/Orbits: No acute finding.  Other: None IMPRESSION: Negative CT examination of the brain without contrast Electronically Signed   By: Jasmine Pang M.D.   On: 07/11/2017 16:42    Procedures Procedures (including critical care time)  Medications Ordered in ED Medications  bacitracin ointment (not administered)  levETIRAcetam (KEPPRA) 1,000 mg in sodium chloride 0.9 % 100 mL IVPB (0 mg Intravenous Stopped 07/11/17 1652)  sodium chloride 0.9 % bolus 1,000 mL (0 mLs Intravenous Stopped 07/11/17 1753)     Initial Impression / Assessment and Plan / ED Course  I have reviewed the triage vital signs and the nursing notes.  Pertinent labs & imaging results that were available during my care of the patient were reviewed by me and considered in my medical decision making (see chart for details).     21 year old male with past medical history of epilepsy here with breakthrough seizure after recently stopping his Keppra.  He is back to his baseline.  CT head negative after hitting it, and will dress his wound with bacitracin.  There are no lacerations.  Has no neck pain.  Screening electrolytes are negative.  I suspect this is breakthrough in the setting of decreased sleep due to increased caffeine use, stress,  as well as possibly recently stopping his Keppra.  Discussed case with tele-neurology who recommends increasing his Keppra to 500 mg twice a day with outpatient follow-up.  Patient understands and is in agreement.  Patient remains at baseline.  Family present at the bedside.  Discussed his medication changes, encouraged adherence, avoidance of excessive caffeine, good sleep habits, and also discussed that he should not drive until cleared by neurologist.   Final Clinical Impressions(s) / ED Diagnoses   Final diagnoses:  Breakthrough seizure (HCC)    [facility]        Ordered    levETIRAcetam (KEPPRA) 500 MG tablet  2 times daily     07/11/17 1817       Shaune PollackIsaacs, Jahquan Klugh, MD 07/11/17 1831

## 2017-07-11 NOTE — ED Notes (Signed)
Patient transported to CT 

## 2017-07-12 NOTE — Telephone Encounter (Signed)
1.  I want him to restart levetiracetam 500mg  twice daily. 2.  I want him to finish out the week taking immediate release lamotrigine 50mg  twice daily.  Then, I want him to increase dose, taking 50mg  in AM and 100mg  in PM for 1 week, then 100mg  twice daily.   3.  Once he starts 100mg  twice daily, he may discontinue the Keppra again. 4.  Unfortunately, he won't be able to drive for another 6 months, assuming he does not have any recurrent seizure. 5.  I want him to schedule a follow up with me in 3 months.

## 2017-07-12 NOTE — Telephone Encounter (Signed)
LM on Pts and his mother Robyns VM to call me back about medication changes

## 2017-07-12 NOTE — Telephone Encounter (Signed)
Robyn rtrnd my call, advised her of medication changes, advsd will send in to [facility]. Advsd of driving restriction. Trx to reception for 106mo f/u. Called [facility], spoke with Toni Amendourtney.

## 2017-07-19 ENCOUNTER — Telehealth: Payer: Self-pay | Admitting: Neurology

## 2017-07-19 NOTE — Telephone Encounter (Signed)
I spoke with Pt, he wanted to go over increased medication he started today, just to make sure he had it correctly. He said he has had 2 occurrences, duration 10 seconds at the most, where he felt light headed and possibly slight tunnel vision, the same as what he felt like prior to the last seizure. He said these were definitely not seizures. He immediately sat down and felt better. Just wanted us to be aware, and if anything changes, he will call back.

## 2017-07-19 NOTE — Telephone Encounter (Signed)
Patient wants to talk to someone about medication having light headed and tunnel vision

## 2017-08-20 ENCOUNTER — Ambulatory Visit: Payer: BLUE CROSS/BLUE SHIELD | Admitting: Neurology

## 2017-08-21 ENCOUNTER — Telehealth: Payer: Self-pay | Admitting: Neurology

## 2017-08-21 NOTE — Telephone Encounter (Signed)
Called [facility], spoke with Toni Amendourtney, called in 100 mg this time (1 BID) #180 R3 and advsd her to alert Pt of mg change. Called and spoke with Pt, also advsd him of mg change

## 2017-08-21 NOTE — Telephone Encounter (Signed)
Patient called and needs a refill on his Lamotrigine medication. He uses [facility]. He has until tomorrow and then he will be out. Thanks

## 2017-09-17 ENCOUNTER — Telehealth: Payer: Self-pay | Admitting: *Deleted

## 2017-09-17 NOTE — Telephone Encounter (Signed)
Patient called over the weekend to our answering service reporting a strange aura that he was feeling.  I called him back and he said that he was out with friends and would have a strong aura that would last a few seconds then go away.  I happened about 4-5 times.  He got a ride home and then he felt better and it has not happened since.  Instructed him to document when this does happen and call us back.

## 2017-09-21 ENCOUNTER — Ambulatory Visit: Payer: BLUE CROSS/BLUE SHIELD | Admitting: Neurology

## 2017-11-13 ENCOUNTER — Telehealth: Payer: Self-pay | Admitting: Neurology

## 2017-11-13 NOTE — Telephone Encounter (Signed)
Pt left a VM message saying the medication was working better but he was experiencing some weird feelings and wanted a call back to discuss that, thinks maybe the medication should be increased

## 2017-11-14 NOTE — Telephone Encounter (Signed)
Called and spoke with Pt. He is c/o of "zapping" or "jolting" sensations in his head. They do not last long. He is questioning if he should increase lamotrigine. He is currently taking 100mg  BID.  He has had one aura recently as well, lasting several seconds.

## 2017-11-14 NOTE — Telephone Encounter (Signed)
Called and spoke with Pt, advsd him of lamotrigine increase, calendar/journal, and made a follow up appt for Pt in August. Pt will call when needs a refill on lamotrigine.

## 2017-11-14 NOTE — Telephone Encounter (Signed)
He can increase lamotrigine to 150mg  twice daily.  I also want him to keep a calendar/journal and record when he has an aura or zapping sensation so we can see if they lessen.  He also needs to make a follow up appointment with me.

## 2017-11-19 ENCOUNTER — Encounter: Payer: Self-pay | Admitting: Neurology

## 2017-11-30 ENCOUNTER — Ambulatory Visit: Payer: BLUE CROSS/BLUE SHIELD | Admitting: Neurology

## 2018-03-01 ENCOUNTER — Encounter: Payer: Self-pay | Admitting: Neurology

## 2018-03-01 ENCOUNTER — Ambulatory Visit (INDEPENDENT_AMBULATORY_CARE_PROVIDER_SITE_OTHER): Payer: BLUE CROSS/BLUE SHIELD | Admitting: Neurology

## 2018-03-01 VITALS — BP 108/78 | HR 104 | Ht 67.0 in | Wt 149.0 lb

## 2018-03-01 DIAGNOSIS — G40909 Epilepsy, unspecified, not intractable, without status epilepticus: Secondary | ICD-10-CM

## 2018-03-01 DIAGNOSIS — G44219 Episodic tension-type headache, not intractable: Secondary | ICD-10-CM | POA: Diagnosis not present

## 2018-03-01 MED ORDER — LAMOTRIGINE 150 MG PO TABS: 150.0000 mg | ORAL_TABLET | Freq: Two times a day (BID) | ORAL | 5 refills | Status: DC

## 2018-03-01 NOTE — Progress Notes (Signed)
NEUROLOGY FOLLOW UP OFFICE NOTE  Matthew Barr [id number]  HISTORY OF PRESENT ILLNESS: Matthew Barr is a 22 year old right-handed male with no past medical history who follows up for seizures.     UPDATE: Last visit in December, he had reached Lamical titration of 50mg  twice daily and Keppra was discontinued.  The following day (07/11/17), he had another seizure.  In the ED, CT of head was normal.  He reported decreased sleep and increased caffeine intake.  He was restarted on Keppra at 500mg  twice daily.  Lamictal was further titrated to 100mg  twice daily and Keppra was again discontinued.  He infrequently would experience auras.  In April, he started experiencing "zapping" sensations in his head.  Lamictal was increased to 150mg  twice daily.  He has not had any further auras.  He occasionally has moderate bitemporal nonthrobbing headache, not associated with other symptoms.  They last 30 to 60 minutes with ibuprofen.  They are infrequent.   HISTORY: On 09/04/09, he had a new onset seizure.  He was on his bed when he blacked out rolling over onto the floor.  His mother found him on the floor convulsing with foam from his mouth but no tongue biting or incontinence.  It lasted 60 to 90 seconds.  He was postictal afterward.  CT of head was unremarkable.  He did not follow up with neurology.   On 01/21/17, he was outside grilling when he suddenly dropped to the ground, hitting his head on the grill.  He exhibited extensor posturing and shaking.  It lasted about a couple of minutes.  When he woke up, he was pale and vomited.  He was confused and had a headache.  He presented to the ED for further evaluation.  CT of head was unremarkable.  CBC, BMP and EKG were normal.  He was discharged with instructions to follow up with neurology.    He never followed up with neurology.  On 06/02/17, he had a second witnessed seizure.  It was preceded by an undescribable aura for a few seconds prior to passing out.   He was postictal for about 10 to 15 minutes.  Afterwards, he was nauseous with 2 episodes of vomiting.  He returned to the ED where repeat head CT was unremarkable.  CBC and BMP were unremarkable.  Workup: One hour sleep deprived EEG from 06/18/17 was normal. MRI of brain without contrast with seizure protocol from 06/27/17 was personally reviewed and was normal.   He was delivered via C-section because he was breech, but otherwise uncomplicated birth.  He had no history of febrile seizures, meningitis/encephalitis, head trauma. His father may have had a questionable seizure while sick with the flu.  His maternal grandmother's uncle reportedly had epilepsy.  PAST MEDICAL HISTORY: No  MEDICATIONS: Lamotrigine 150mg  twice daily  ALLERGIES: No Known Allergies  FAMILY HISTORY: No family history on file.  SOCIAL HISTORY: Social History   Socioeconomic History  . Marital status: Single    Spouse name: Not on file  . Number of children: 0  . Years of education: some college  . Highest education level: Some college, no degree  Occupational History  . Occupation: Producer, television/film/video  . Financial resource strain: Not on file  . Food insecurity:    Worry: Not on file    Inability: Not on file  . Transportation needs:    Medical: Not on file    Non-medical: Not on file  Tobacco Use  .  Smoking status: Never Smoker  . Smokeless tobacco: Never Used  Substance and Sexual Activity  . Alcohol use: Yes    Comment: 2-3 beers a month  . Drug use: No  . Sexual activity: Not on file  Lifestyle  . Physical activity:    Days per week: 4 days    Minutes per session: 60 min  . Stress: Not on file  Relationships  . Social connections:    Talks on phone: Not on file    Gets together: Not on file    Attends religious service: Not on file    Active member of club or organization: Not on file    Attends meetings of clubs or organizations: Not on file    Relationship status: Not on file    . Intimate partner violence:    Fear of current or ex partner: Not on file    Emotionally abused: Not on file    Physically abused: Not on file    Forced sexual activity: Not on file  Other Topics Concern  . Not on file  Social History Narrative   Single, lives with parents in 2 story home,, is student, pursueing communications degree. Has 3 dogs, two cats. Drinks 2 cups coffee a day. Exercises every other day.    REVIEW OF SYSTEMS: Constitutional: No fevers, chills, or sweats, no generalized fatigue, change in appetite Eyes: No visual changes, double vision, eye pain Ear, nose and throat: No hearing loss, ear pain, nasal congestion, sore throat Cardiovascular: No chest pain, palpitations Respiratory:  No shortness of breath at rest or with exertion, wheezes GastrointestinaI: No nausea, vomiting, diarrhea, abdominal pain, fecal incontinence Genitourinary:  No dysuria, urinary retention or frequency Musculoskeletal:  No neck pain, back pain Integumentary: No rash, pruritus, skin lesions Neurological: as above Psychiatric: No depression, insomnia, anxiety Endocrine: No palpitations, fatigue, diaphoresis, mood swings, change in appetite, change in weight, increased thirst Hematologic/Lymphatic:  No purpura, petechiae. Allergic/Immunologic: no itchy/runny eyes, nasal congestion, recent allergic reactions, rashes  PHYSICAL EXAM: Vitals:   03/01/18 1346  BP: 108/78  Pulse: (!) 104  SpO2: 96%   General: No acute distress.  Patient appears well-groomed.  normal body habitus. Head:  Normocephalic/atraumatic Eyes:  Fundi examined but not visualized Neck: supple, no paraspinal tenderness, full range of motion Heart:  Regular rate and rhythm Lungs:  Clear to auscultation bilaterally Back: No paraspinal tenderness Neurological Exam: alert and oriented to person, place, and time. Attention span and concentration intact, recent and remote memory intact, fund of knowledge intact.  Speech  fluent and not dysarthric, language intact.  CN II-XII intact. Bulk and tone normal, muscle strength 5/5 throughout.  Sensation to light touch  intact.  Deep tendon reflexes 2+ throughout.  Finger to nose testing intact.  Gait normal, Romberg negative.  IMPRESSION: Seizure disorder Tension-type headache  PLAN: 1.  Continue lamotrigine 150mg  twice daily 2.  He may resume driving 3.  Follow up in 6 months.  25 minutes spent face to face with patient, over 50% spent discussing management.  Shon MilletAdam Jaffe, DO

## 2018-03-01 NOTE — Patient Instructions (Signed)
1.  Continue lamotrigine 150mg  twice daily 2.  You may resume driving. 3.  Follow up in 6 months.

## 2018-07-15 ENCOUNTER — Other Ambulatory Visit: Payer: Self-pay | Admitting: *Deleted

## 2018-07-15 MED ORDER — LAMOTRIGINE 150 MG PO TABS: 150.0000 mg | ORAL_TABLET | Freq: Two times a day (BID) | ORAL | 5 refills | Status: DC

## 2018-09-02 NOTE — Progress Notes (Deleted)
NEUROLOGY FOLLOW UP OFFICE NOTE  Matthew Barr [id number]  HISTORY OF PRESENT ILLNESS: Matthew Barr is a 23 year old right-handed man who follows up for seizures.  UPDATE:  Current medication: Lamotrigine 150 mg twice daily  ***  HISTORY: On 09/04/09, he had a new onset seizure.  He was on his bed when he blacked out rolling over onto the floor.  His mother found him on the floor convulsing with foam from his mouth but no tongue biting or incontinence.  It lasted 60 to 90 seconds.  He was postictal afterward.  CT of head was unremarkable.  He did not follow up with neurology.  On 01/21/17, he was outside grilling when he suddenly dropped to the ground, hitting his head on the grill.  He exhibited extensor posturing and shaking.  It lasted about a couple of minutes.  When he woke up, he was pale and vomited.  He was confused and had a headache.  He presented to the ED for further evaluation.  CT of head was unremarkable.  CBC, BMP and EKG were normal.  He was discharged with instructions to follow up with neurology.  He never followed up with neurology.  On 06/02/17, he had a second witnessed seizure.  It was preceded by an undescribable aura for a few seconds prior to passing out.  He was postictal for about 10 to 15 minutes.  Afterwards, he was nauseous with 2 episodes of vomiting.  He returned to the ED where repeat head CT was unremarkable.  CBC and BMP were unremarkable.  He was started on a Lamictal titration later that month.  On 07/10/17, he had reached Lamical titration of 50mg  twice daily and Keppra was discontinued.  The following day, he had another seizure.  In the ED, CT of head was normal.  He reported decreased sleep and increased caffeine intake.  He was restarted on Keppra at 500mg  twice daily.  Lamictal was further titrated to 100mg  twice daily and Keppra was again discontinued.  Workup: One hour sleep deprived EEG from 06/18/17 was normal. MRI of brain without contrast with  seizure protocol from 06/27/17 was personally reviewed and was normal.  He was delivered via C-section because he was breech, but otherwise uncomplicated birth.  He had no history of febrile seizures, meningitis/encephalitis, head trauma. His father may have had a questionable seizure while sick with the flu.  His maternal grandmother's uncle reportedly had epilepsy.  PAST MEDICAL HISTORY: Seizure disorder  MEDICATIONS: Current Outpatient Medications on File Prior to Visit  Medication Sig Dispense Refill  . lamoTRIgine (LAMICTAL) 150 MG tablet Take 1 tablet (150 mg total) by mouth 2 (two) times daily. 60 tablet 5   No current facility-administered medications on file prior to visit.     ALLERGIES: No Known Allergies  FAMILY HISTORY: Maternal grandmother's uncle:  Seizure distorder  SOCIAL HISTORY: Social History   Socioeconomic History  . Marital status: Single    Spouse name: Not on file  . Number of children: 0  . Years of education: some college  . Highest education level: Some college, no degree  Occupational History  . Occupation: Producer, television/film/video  . Financial resource strain: Not on file  . Food insecurity:    Worry: Not on file    Inability: Not on file  . Transportation needs:    Medical: Not on file    Non-medical: Not on file  Tobacco Use  . Smoking status: Never Smoker  . Smokeless tobacco:  Never Used  Substance and Sexual Activity  . Alcohol use: Yes    Comment: 2-3 beers a month  . Drug use: No  . Sexual activity: Not on file  Lifestyle  . Physical activity:    Days per week: 4 days    Minutes per session: 60 min  . Stress: Not on file  Relationships  . Social connections:    Talks on phone: Not on file    Gets together: Not on file    Attends religious service: Not on file    Active member of club or organization: Not on file    Attends meetings of clubs or organizations: Not on file    Relationship status: Not on file  . Intimate  partner violence:    Fear of current or ex partner: Not on file    Emotionally abused: Not on file    Physically abused: Not on file    Forced sexual activity: Not on file  Other Topics Concern  . Not on file  Social History Narrative   Single, lives with parents in 2 story home,, is student, pursueing communications degree. Has 3 dogs, two cats. Drinks 2 cups coffee a day. Exercises every other day.    REVIEW OF SYSTEMS: Constitutional: No fevers, chills, or sweats, no generalized fatigue, change in appetite Eyes: No visual changes, double vision, eye pain Ear, nose and throat: No hearing loss, ear pain, nasal congestion, sore throat Cardiovascular: No chest pain, palpitations Respiratory:  No shortness of breath at rest or with exertion, wheezes GastrointestinaI: No nausea, vomiting, diarrhea, abdominal pain, fecal incontinence Genitourinary:  No dysuria, urinary retention or frequency Musculoskeletal:  No neck pain, back pain Integumentary: No rash, pruritus, skin lesions Neurological: as above Psychiatric: No depression, insomnia, anxiety Endocrine: No palpitations, fatigue, diaphoresis, mood swings, change in appetite, change in weight, increased thirst Hematologic/Lymphatic:  No purpura, petechiae. Allergic/Immunologic: no itchy/runny eyes, nasal congestion, recent allergic reactions, rashes  PHYSICAL EXAM: *** General: No acute distress.  Patient appears ***-groomed.  *** body habitus. Head:  Normocephalic/atraumatic Eyes:  Fundi examined but not visualized Neck: supple, no paraspinal tenderness, full range of motion Heart:  Regular rate and rhythm Lungs:  Clear to auscultation bilaterally Back: No paraspinal tenderness Neurological Exam: alert and oriented to person, place, and time. Attention span and concentration intact, recent and remote memory intact, fund of knowledge intact.  Speech fluent and not dysarthric, language intact.  CN II-XII intact. Bulk and tone normal,  muscle strength 5/5 throughout.  Sensation to light touch, temperature and vibration intact.  Deep tendon reflexes 2+ throughout, toes downgoing.  Finger to nose and heel to shin testing intact.  Gait normal, Romberg negative.  IMPRESSION: Seizure disorder  PLAN: 1.  Lamotrigine 150 mg twice daily 2.  Check trough lamotrigine level, CBC and CMP 3.  Follow-up in 6 months.  Shon Millet, DO  CC: ***

## 2018-09-03 ENCOUNTER — Encounter: Payer: Self-pay | Admitting: Neurology

## 2018-09-03 ENCOUNTER — Ambulatory Visit: Payer: BLUE CROSS/BLUE SHIELD | Admitting: Neurology

## 2018-09-03 DIAGNOSIS — Z029 Encounter for administrative examinations, unspecified: Secondary | ICD-10-CM

## 2018-09-25 NOTE — Progress Notes (Signed)
NEUROLOGY FOLLOW UP OFFICE NOTE  Matthew Barr [id number]  HISTORY OF PRESENT ILLNESS: Matthew Barr is a 23 year old right-handed man who follows up for seizures.  UPDATE:  Current medication: Lamotrigine 150 mg twice daily  Doing well.  No recurrent seizures or aura.  HISTORY: On 09/04/09, he had a new onset seizure.  He was on his bed when he blacked out rolling over onto the floor.  His mother found him on the floor convulsing with foam from his mouth but no tongue biting or incontinence.  It lasted 60 to 90 seconds.  He was postictal afterward.  CT of head was unremarkable.  He did not follow up with neurology.  On 01/21/17, he was outside grilling when he suddenly dropped to the ground, hitting his head on the grill.  He exhibited extensor posturing and shaking.  It lasted about a couple of minutes.  When he woke up, he was pale and vomited.  He was confused and had a headache.  He presented to the ED for further evaluation.  CT of head was unremarkable.  CBC, BMP and EKG were normal.  He was discharged with instructions to follow up with neurology.  He never followed up with neurology.  On 06/02/17, he had a second witnessed seizure.  It was preceded by an undescribable aura for a few seconds prior to passing out.  He was postictal for about 10 to 15 minutes.  Afterwards, he was nauseous with 2 episodes of vomiting.  He returned to the ED where repeat head CT was unremarkable.  CBC and BMP were unremarkable.  He was started on a Lamictal titration later that month.  On 07/10/17, he had reached Lamical titration of 50mg  twice daily and Keppra was discontinued.  The following day, he had another seizure.  In the ED, CT of head was normal.  He reported decreased sleep and increased caffeine intake.  He was restarted on Keppra at 500mg  twice daily.  Lamictal was further titrated to 100mg  twice daily and Keppra was again discontinued.  Workup: One hour sleep deprived EEG from 06/18/17 was  normal. MRI of brain without contrast with seizure protocol from 06/27/17 was personally reviewed and was normal.  He was delivered via C-section because he was breech, but otherwise uncomplicated birth.  He had no history of febrile seizures, meningitis/encephalitis, head trauma. His father may have had a questionable seizure while sick with the flu.  His maternal grandmother's uncle reportedly had epilepsy.  PAST MEDICAL HISTORY: Seizure disorder  MEDICATIONS: Current Outpatient Medications on File Prior to Visit  Medication Sig Dispense Refill  . lamoTRIgine (LAMICTAL) 150 MG tablet Take 1 tablet (150 mg total) by mouth 2 (two) times daily. 60 tablet 5   No current facility-administered medications on file prior to visit.     ALLERGIES: No Known Allergies  FAMILY HISTORY: Maternal grandmother's uncle:  Seizure distorder  SOCIAL HISTORY: Social History   Socioeconomic History  . Marital status: Single    Spouse name: Not on file  . Number of children: 0  . Years of education: some college  . Highest education level: Some college, no degree  Occupational History  . Occupation: Producer, television/film/video  . Financial resource strain: Not on file  . Food insecurity:    Worry: Not on file    Inability: Not on file  . Transportation needs:    Medical: Not on file    Non-medical: Not on file  Tobacco Use  . Smoking  status: Never Smoker  . Smokeless tobacco: Never Used  Substance and Sexual Activity  . Alcohol use: Yes    Comment: 2-3 beers a month  . Drug use: No  . Sexual activity: Not on file  Lifestyle  . Physical activity:    Days per week: 4 days    Minutes per session: 60 min  . Stress: Not on file  Relationships  . Social connections:    Talks on phone: Not on file    Gets together: Not on file    Attends religious service: Not on file    Active member of club or organization: Not on file    Attends meetings of clubs or organizations: Not on file     Relationship status: Not on file  . Intimate partner violence:    Fear of current or ex partner: Not on file    Emotionally abused: Not on file    Physically abused: Not on file    Forced sexual activity: Not on file  Other Topics Concern  . Not on file  Social History Narrative   Single, lives with parents in 2 story home,, is student, pursueing communications degree. Has 3 dogs, two cats. Drinks 2 cups coffee a day. Exercises every other day.    REVIEW OF SYSTEMS: Constitutional: No fevers, chills, or sweats, no generalized fatigue, change in appetite Eyes: No visual changes, double vision, eye pain Ear, nose and throat: No hearing loss, ear pain, nasal congestion, sore throat Cardiovascular: No chest pain, palpitations Respiratory:  No shortness of breath at rest or with exertion, wheezes GastrointestinaI: No nausea, vomiting, diarrhea, abdominal pain, fecal incontinence Genitourinary:  No dysuria, urinary retention or frequency Musculoskeletal:  No neck pain, back pain Integumentary: No rash, pruritus, skin lesions Neurological: as above Psychiatric: No depression, insomnia, anxiety Endocrine: No palpitations, fatigue, diaphoresis, mood swings, change in appetite, change in weight, increased thirst Hematologic/Lymphatic:  No purpura, petechiae. Allergic/Immunologic: no itchy/runny eyes, nasal congestion, recent allergic reactions, rashes  PHYSICAL EXAM: Blood pressure 106/64, pulse 92, height 5\' 7"  (1.702 m), weight 146 lb (66.2 kg), SpO2 100 %. General: No acute distress.  Patient appears well-groomed.  * Head:  Normocephalic/atraumatic Eyes:  Fundi examined but not visualized Neck: supple, no paraspinal tenderness, full range of motion Heart:  Regular rate and rhythm Lungs:  Clear to auscultation bilaterally Back: No paraspinal tenderness Neurological Exam: alert and oriented to person, place, and time. Attention span and concentration intact, recent and remote memory  intact, fund of knowledge intact.  Speech fluent and not dysarthric, language intact.  CN II-XII intact. Bulk and tone normal, muscle strength 5/5 throughout.  Sensation to light touch, temperature and vibration intact.  Deep tendon reflexes 2+ throughout, toes downgoing.  Finger to nose and heel to shin testing intact.  Gait normal, Romberg negative.  IMPRESSION: Seizure disorder  PLAN: 1.  Lamotrigine 150 mg twice daily 2.  Check  CBC and CMP 3.  Follow-up in one year.  21 minutes spent face to face with patient, over 50% spent discussing management.  Shon Millet, DO

## 2018-09-27 ENCOUNTER — Encounter: Payer: Self-pay | Admitting: Neurology

## 2018-09-27 ENCOUNTER — Other Ambulatory Visit (INDEPENDENT_AMBULATORY_CARE_PROVIDER_SITE_OTHER): Payer: BLUE CROSS/BLUE SHIELD

## 2018-09-27 ENCOUNTER — Other Ambulatory Visit: Payer: Self-pay

## 2018-09-27 ENCOUNTER — Ambulatory Visit (INDEPENDENT_AMBULATORY_CARE_PROVIDER_SITE_OTHER): Payer: BLUE CROSS/BLUE SHIELD | Admitting: Neurology

## 2018-09-27 VITALS — BP 106/64 | HR 92 | Ht 67.0 in | Wt 146.0 lb

## 2018-09-27 DIAGNOSIS — G40909 Epilepsy, unspecified, not intractable, without status epilepticus: Secondary | ICD-10-CM

## 2018-09-27 MED ORDER — LAMOTRIGINE 150 MG PO TABS: 150.0000 mg | ORAL_TABLET | Freq: Two times a day (BID) | ORAL | 3 refills | Status: DC

## 2018-09-27 NOTE — Patient Instructions (Addendum)
1.  Lamotrigine 150mg  twice daily refilled (90 day supply with 3 refills) 2.  Check CBC with diff, CMP  Your provider requests that you have LABS drawn today.  We share a lab with [facility] - they are located in [address] (second floor) of this building.  Once you get there, please have a seat and the phlebotomist will call your name.  If you have waited more than 15 minutes, please advise the front desk  3.  Follow up in one year or as needed

## 2018-09-28 LAB — COMPREHENSIVE METABOLIC PANEL
AG RATIO: 1.9 (calc) (ref 1.0–2.5)
ALBUMIN MSPROF: 4.6 g/dL (ref 3.6–5.1)
ALT: 14 U/L (ref 9–46)
AST: 11 U/L (ref 10–40)
Alkaline phosphatase (APISO): 65 U/L (ref 36–130)
BILIRUBIN TOTAL: 0.6 mg/dL (ref 0.2–1.2)
BUN: 13 mg/dL (ref 7–25)
CALCIUM: 9.7 mg/dL (ref 8.6–10.3)
CHLORIDE: 102 mmol/L (ref 98–110)
CO2: 29 mmol/L (ref 20–32)
Creat: 1.34 mg/dL (ref 0.60–1.35)
GLOBULIN: 2.4 g/dL (ref 1.9–3.7)
GLUCOSE: 88 mg/dL (ref 65–99)
POTASSIUM: 4.4 mmol/L (ref 3.5–5.3)
SODIUM: 142 mmol/L (ref 135–146)
TOTAL PROTEIN: 7 g/dL (ref 6.1–8.1)

## 2018-09-28 LAB — CBC WITH DIFFERENTIAL/PLATELET
Absolute Monocytes: 408 cells/uL (ref 200–950)
Basophils Absolute: 29 cells/uL (ref 0–200)
Basophils Relative: 0.6 %
EOS PCT: 1.5 %
Eosinophils Absolute: 72 cells/uL (ref 15–500)
HEMATOCRIT: 40.9 % (ref 38.5–50.0)
Hemoglobin: 14.4 g/dL (ref 13.2–17.1)
LYMPHS ABS: 1061 {cells}/uL (ref 850–3900)
MCH: 31.2 pg (ref 27.0–33.0)
MCHC: 35.2 g/dL (ref 32.0–36.0)
MCV: 88.5 fL (ref 80.0–100.0)
MONOS PCT: 8.5 %
MPV: 10.2 fL (ref 7.5–12.5)
NEUTROS ABS: 3230 {cells}/uL (ref 1500–7800)
NEUTROS PCT: 67.3 %
Platelets: 263 10*3/uL (ref 140–400)
RBC: 4.62 10*6/uL (ref 4.20–5.80)
RDW: 12.1 % (ref 11.0–15.0)
Total Lymphocyte: 22.1 %
WBC: 4.8 10*3/uL (ref 3.8–10.8)

## 2018-10-03 ENCOUNTER — Telehealth: Payer: Self-pay

## 2018-10-03 NOTE — Telephone Encounter (Signed)
-----   Message from Drema Dallas, DO sent at 09/30/2018  7:25 AM EDT ----- Labs normal

## 2018-10-03 NOTE — Telephone Encounter (Signed)
Called and advised Pt of normal labs

## 2018-10-14 ENCOUNTER — Other Ambulatory Visit: Payer: Self-pay | Admitting: Neurology

## 2018-12-03 ENCOUNTER — Telehealth: Payer: Self-pay | Admitting: Neurology

## 2018-12-03 NOTE — Telephone Encounter (Signed)
Patient called regarding his medication being denied Lamotrigine. He said the dosage had been changed. He uses [facility]. Please Call. Thanks

## 2018-12-04 ENCOUNTER — Other Ambulatory Visit: Payer: Self-pay

## 2018-12-04 MED ORDER — LAMOTRIGINE 150 MG PO TABS: 150.0000 mg | ORAL_TABLET | Freq: Two times a day (BID) | ORAL | 3 refills | Status: DC

## 2018-12-04 NOTE — Telephone Encounter (Signed)
Rx sent in to [facility] pharmacy

## 2018-12-17 ENCOUNTER — Encounter: Payer: Self-pay | Admitting: Physician Assistant

## 2018-12-17 ENCOUNTER — Ambulatory Visit (INDEPENDENT_AMBULATORY_CARE_PROVIDER_SITE_OTHER): Payer: BLUE CROSS/BLUE SHIELD | Admitting: Physician Assistant

## 2018-12-17 DIAGNOSIS — R1032 Left lower quadrant pain: Secondary | ICD-10-CM

## 2018-12-17 NOTE — Progress Notes (Signed)
Virtual Visit via Video   I connected with Matthew Barr on 12/17/18 at  2:20 PM EDT by a video enabled telemedicine application and verified that I am speaking with the correct person using two identifiers. Location patient: Home Location provider: [facility], Office Persons participating in the virtual visit: Matthew Barr, Glenda Spelman PA-C, Corky Mullonna Orphanos, LPN   I discussed the limitations of evaluation and management by telemedicine and the availability of in person appointments. The patient expressed understanding and agreed to proceed.  I acted as a Neurosurgeonscribe for Energy East CorporationSamantha Zaydyn Havey, PA-C Kimberly-ClarkDonna Orphanos, LPN  Subjective:   HPI:  Pt is Establishing care today with our office.  Pt c/o bilateral groin pain x 2 months has increased in the past 2.5 weeks. Pt was doing reverse crunches the other day and started to have some LLQ pain, specifically had a "pulling sensation." Denies fever, chills, back pain, dysuria, constipation or diarrhea. Pt thought maybe a hernia but does not see or feel bulge. Pain is not severe, he has not taken any medication.  He states that he does martial arts and has had bilateral pain in his groin since doing stretches that he later found out "weren't good for him" to do. He does crossfit type exercises and strengthening as able.  ROS: See pertinent positives and negatives per HPI.  There are no active problems to display for this patient.   Social History   Tobacco Use  . Smoking status: Never Smoker  . Smokeless tobacco: Never Used  Substance Use Topics  . Alcohol use: Yes    Comment: 2-3 beers a month    Current Outpatient Medications:  .  ibuprofen (ADVIL) 200 MG tablet, Take 400 mg by mouth as needed., Disp: , Rfl:  .  lamoTRIgine (LAMICTAL) 150 MG tablet, Take 1 tablet (150 mg total) by mouth 2 (two) times daily., Disp: 180 tablet, Rfl: 3 .  Multiple Vitamin (MULTIVITAMIN) tablet, Take 1 tablet by mouth once a week., Disp: , Rfl:   No Known  Allergies  Objective:   VITALS: Per patient if applicable, see vitals. GENERAL: Alert, appears well and in no acute distress. HEENT: Atraumatic, conjunctiva clear, no obvious abnormalities on inspection of external nose and ears. NECK: Normal movements of the head and neck. CARDIOPULMONARY: No increased WOB. Speaking in clear sentences. I:E ratio WNL.  MS: Moves all visible extremities without noticeable abnormality. PSYCH: Pleasant and cooperative, well-groomed. Speech normal rate and rhythm. Affect is appropriate. Insight and judgement are appropriate. Attention is focused, linear, and appropriate.  NEURO: CN grossly intact. Oriented as arrived to appointment on time with no prompting. Moves both UE equally.  SKIN: No obvious lesions, wounds, erythema, or cyanosis noted on face or hands.  Assessment and Plan:   Jeanella AntonReece was seen today for establish care and bilateral groin pain.  Diagnoses and all orders for this visit:  Left inguinal pain -     Ambulatory referral to Sports Medicine   No red flags on exam. Suspect muscle strain vs hernia. Discussed that he needs in-office evaluation of his pain. He would like to do this with sports medicine provider, Dr. Katrinka BlazingSmith after discussing our options. Did discuss that I cannot rule out hernia at this time and we did discuss worsening precautions, such as severe abdominal pain, which would warrant ER eval. Patient verbalized understanding to plan.  . Reviewed expectations re: course of current medical issues. . Discussed self-management of symptoms. . Outlined signs and symptoms indicating need  for more acute intervention. . Patient verbalized understanding and all questions were answered. Marland Kitchen Health Maintenance issues including appropriate healthy diet, exercise, and smoking avoidance were discussed with patient. . See orders for this visit as documented in the electronic medical record.  I discussed the assessment and treatment plan with the  patient. The patient was provided an opportunity to ask questions and all were answered. The patient agreed with the plan and demonstrated an understanding of the instructions.   The patient was advised to call back or seek an in-person evaluation if the symptoms worsen or if the condition fails to improve as anticipated.   CMA or LPN served as scribe during this visit. History, Physical, and Plan performed by medical provider. The above documentation has been reviewed and is accurate and complete.  [employer], [place] 12/17/2018

## 2018-12-31 ENCOUNTER — Encounter: Payer: Self-pay | Admitting: Family Medicine

## 2018-12-31 ENCOUNTER — Ambulatory Visit (INDEPENDENT_AMBULATORY_CARE_PROVIDER_SITE_OTHER): Payer: BC Managed Care – PPO | Admitting: Family Medicine

## 2018-12-31 ENCOUNTER — Other Ambulatory Visit: Payer: Self-pay

## 2018-12-31 VITALS — BP 106/72 | HR 102 | Ht 67.0 in | Wt 142.0 lb

## 2018-12-31 DIAGNOSIS — M25552 Pain in left hip: Secondary | ICD-10-CM

## 2018-12-31 DIAGNOSIS — M25551 Pain in right hip: Secondary | ICD-10-CM | POA: Diagnosis not present

## 2018-12-31 DIAGNOSIS — R102 Pelvic and perineal pain: Secondary | ICD-10-CM

## 2018-12-31 MED ORDER — FLUCONAZOLE 200 MG PO TABS: 200.0000 mg | ORAL_TABLET | Freq: Every day | ORAL | 0 refills | Status: DC

## 2018-12-31 NOTE — Patient Instructions (Addendum)
Good to see you  I think the part is mostly friction related.  Lotion the area 2 times a day  Xray and labs today  Vitamin D 4000 IU daily for bone pain and muscle strength  Diflucan in case it is a infection and take daily for 3 days  I think it should do relatively well.  HAve an appointment set up again in 6 weeks

## 2018-12-31 NOTE — Assessment & Plan Note (Signed)
Patient does have some nonspecific perineal pain.  Discussed with patient at this time that I will get x-rays to rule out think there is osteitis pubis.  We discussed laboratory work-up including PSA and sedimentation rate to rule out any type of infectious etiology.  Patient declined both of these at the moment secondary to financial constraints.  Discussed with patient about topical anti-inflammatories.  We discussed Diflucan secondary to the erythema in the left flank area.  Patient can follow-up again in 4 weeks to see how patient responds.  Patient seen when he was going to be likely noncompliant.

## 2018-12-31 NOTE — Progress Notes (Addendum)
[facility] [address], [place] [address] Phone: [phone] Subjective:   Fontaine No, am serving as a scribe for Dr. Hulan Saas.  Referring provider: Inda Coke PA  CC: Groin pain  NKN:[id number]  Matthew Barr is a 23 y.o. male coming in with complaint of bilateral groin pain. Pain began 5 months ago. States that he is active and does run. Little to no pain with running Does stretch following his runs. Does perform a groin stretch with all of his weight on his legs. Also does ab work. Works out 3-4 times a week both cardio and weight training. Was doing straight leg raises and felt a pull in the LLQ. Also writes a lot and is in a seated position. Denies any blood in urine. Uses NSAIDs prn. Takes seizure medications.      Past Medical History:  Diagnosis Date  . Epilepsy ([place]) 2018  . Seizures ([place])    No past surgical history on file. Social History   Socioeconomic History  . Marital status: Single    Spouse name: Not on file  . Number of children: 0  . Years of education: some college  . Highest education level: Some college, no degree  Occupational History  . Occupation: Investment banker, operational  . Financial resource strain: Not on file  . Food insecurity:    Worry: Not on file    Inability: Not on file  . Transportation needs:    Medical: Not on file    Non-medical: Not on file  Tobacco Use  . Smoking status: Never Smoker  . Smokeless tobacco: Never Used  Substance and Sexual Activity  . Alcohol use: Yes    Comment: 2-3 beers a month  . Drug use: No  . Sexual activity: Not Currently  Lifestyle  . Physical activity:    Days per week: 4 days    Minutes per session: 60 min  . Stress: Not on file  Relationships  . Social connections:    Talks on phone: Not on file    Gets together: Not on file    Attends religious service: Not on file    Active member of club or organization: Not on file    Attends meetings  of clubs or organizations: Not on file    Relationship status: Not on file  Other Topics Concern  . Not on file  Social History Narrative   Single, lives with parents in 2 story home,, is student, pursueing communications degree. Has 3 dogs, two cats. Drinks 2 cups coffee a day. Exercises every other day.   No Known Allergies No family history on file.     Current Outpatient Medications (Analgesics):  .  ibuprofen (ADVIL) 200 MG tablet, Take 400 mg by mouth as needed.   Current Outpatient Medications (Other):  .  fluconazole (DIFLUCAN) 200 MG tablet, Take 1 tablet (200 mg total) by mouth daily. Marland Kitchen  lamoTRIgine (LAMICTAL) 150 MG tablet, Take 1 tablet (150 mg total) by mouth 2 (two) times daily. .  Multiple Vitamin (MULTIVITAMIN) tablet, Take 1 tablet by mouth once a week.    Past medical history, social, surgical and family history all reviewed in electronic medical record.  No pertanent information unless stated regarding to the chief complaint.   Review of Systems:  No headache, visual changes, nausea, vomiting, diarrhea, constipation, dizziness, abdominal pain, skin rash, fevers, chills, night sweats, weight loss, swollen lymph nodes, body aches, joint swelling, muscle aches,  chest pain, shortness of breath, mood changes.   Objective  Blood pressure 106/72, pulse (!) 102, height 5\' 7"  (1.702 m), weight 142 lb (64.4 kg), SpO2 98 %. Systems examined below as of    General: No apparent distress alert and oriented x3 mood and affect normal, dressed appropriately.  HEENT: Pupils equal, extraocular movements intact  Respiratory: Patient's speak in full sentences and does not appear short of breath  Cardiovascular: No lower extremity edema, non tender, no erythema  Skin: Warm dry intact with no signs of infection or rash on extremities or on axial skeleton.  Abdomen: Soft nontender  Neuro: Cranial nerves II through XII are intact, neurovascularly intact in all extremities with 2+  DTRs and 2+ pulses.  Lymph: No lymphadenopathy of posterior or anterior cervical chain or axillae bilaterally.  Gait normal with good balance and coordination.  MSK:  Non tender with full range of motion and good stability and symmetric strength and tone of shoulders, elbows, wrist, hip, knee and ankles bilaterally.  Joint exam shows the patient does have some erythema groin area.  Possible rash noted.  Otherwise no significant masses palpated.  No patient is doing prostate at this point 2.  Mild positive Faber bilaterally with some discomfort.  Very minimal pain over the pelvic bone as well.   Impression and Recommendations:     This case required medical decision making of moderate complexity. The above documentation has been reviewed and is accurate and complete Matthew SaaZachary M Patrece Tallie, DO       Note: This dictation was prepared with Dragon dictation along with smaller phrase technology. Any transcriptional errors that result from this process are unintentional.

## 2019-09-19 NOTE — Progress Notes (Signed)
NEUROLOGY FOLLOW UP OFFICE NOTE  Matthew Barr [id number]  HISTORY OF PRESENT ILLNESS: Matthew Barr is a 24 year old right-handed man who follows up for seizures.  UPDATE:  Current medication: Lamotrigine 150 mg twice daily  09/27/2018 LABS:  CBC with WBC 4.8, HGB 14.4, HCT 40.9, PLT 263; CMP with Na 142, K 4.4, Cl 102, CO2 29, glucose 88, BUN 13, Cr 1.34, t bili 0.6, ALP 65, AST 11, ALT 14.  Doing well.  No recurrent seizures or aura.  HISTORY: On 09/04/09, he had a new onset seizure. He was on his bed when he blacked out rolling over onto the floor. His mother found him on the floor convulsing with foam from his mouth but no tongue biting or incontinence. It lasted 60 to 90 seconds. He was postictal afterward. CT of head was unremarkable. He did not follow up with neurology.  On 01/21/17, he was outside grilling when he suddenly dropped to the ground, hitting his head on the grill. He exhibited extensor posturing and shaking. It lasted about a couple of minutes. When he woke up, he was pale and vomited. He was confused and had a headache. He presented to the ED for further evaluation. CT of head was unremarkable. CBC, BMP and EKG were normal. He was discharged with instructions to follow up with neurology.  He never followed up with neurology. On 06/02/17, he had a second witnessed seizure. It was preceded by an undescribable aura for a few seconds prior to passing out. He was postictal for about 10 to 15 minutes. Afterwards, he was nauseous with 2 episodes of vomiting. He returned to the ED where repeat head CT was unremarkable. CBC and BMP were unremarkable.  He was started on a Lamictal titration later that month.  On 07/10/17, he had reached Lamical titration of 50mg  twice daily and Keppra was discontinued. The following day, he had another seizure. In the ED, CT of head was normal. He reported decreased sleep and increased caffeine intake. He was restarted on Keppra  at 500mg  twice daily. Lamictal was further titrated to 100mg  twice daily and Keppra was again discontinued.  Workup: One hour sleep deprived EEG from 06/18/17 was normal. MRI of brain without contrast with seizure protocol from 06/27/17 was personally reviewed and was normal.  He was delivered via C-section because he was breech, but otherwise uncomplicated birth. He had no history of febrile seizures, meningitis/encephalitis, head trauma. His father may have had a questionable seizure while sick with the flu. His maternal grandmother's uncle reportedly had epilepsy.  Past Medical History: Past Medical History:  Diagnosis Date  . Epilepsy ([place]) 2018  . Seizures ([place])     Medications: Outpatient Encounter Medications as of 09/22/2019  Medication Sig  . fluconazole (DIFLUCAN) 200 MG tablet Take 1 tablet (200 mg total) by mouth daily.  Marland Kitchen ibuprofen (ADVIL) 200 MG tablet Take 400 mg by mouth as needed.  . lamoTRIgine (LAMICTAL) 150 MG tablet Take 1 tablet (150 mg total) by mouth 2 (two) times daily.  . Multiple Vitamin (MULTIVITAMIN) tablet Take 1 tablet by mouth once a week.   No facility-administered encounter medications on file as of 09/22/2019.    Allergies: No Known Allergies  Family History: No family history on file.  Social History: Social History   Socioeconomic History  . Marital status: Single    Spouse name: Not on file  . Number of children: 0  . Years of education: some college  . Highest education level: Some  college, no degree  Occupational History  . Occupation: Conservation officer, nature  Tobacco Use  . Smoking status: Never Smoker  . Smokeless tobacco: Never Used  Substance and Sexual Activity  . Alcohol use: Yes    Comment: 2-3 beers a month  . Drug use: No  . Sexual activity: Not Currently  Other Topics Concern  . Not on file  Social History Narrative   Single, lives with parents in 2 story home,, is student, pursueing communications degree. Has 3 dogs, two cats.  Drinks 2 cups coffee a day. Exercises every other day.   Social Determinants of Health   Financial Resource Strain:   . Difficulty of Paying Living Expenses: Not on file  Food Insecurity:   . Worried About Programme researcher, broadcasting/film/video in the Last Year: Not on file  . Ran Out of Food in the Last Year: Not on file  Transportation Needs:   . Lack of Transportation (Medical): Not on file  . Lack of Transportation (Non-Medical): Not on file  Physical Activity:   . Days of Exercise per Week: Not on file  . Minutes of Exercise per Session: Not on file  Stress:   . Feeling of Stress : Not on file  Social Connections:   . Frequency of Communication with Friends and Family: Not on file  . Frequency of Social Gatherings with Friends and Family: Not on file  . Attends Religious Services: Not on file  . Active Member of Clubs or Organizations: Not on file  . Attends Banker Meetings: Not on file  . Marital Status: Not on file  Intimate Partner Violence:   . Fear of Current or Ex-Partner: Not on file  . Emotionally Abused: Not on file  . Physically Abused: Not on file  . Sexually Abused: Not on file    Observations/Objective:   Blood pressure 127/80, pulse 87, temperature 98.6 F (37 C), height 5\' 7"  (1.702 m), weight 149 lb (67.6 kg), SpO2 98 %. alert and oriented to person, place, and time. Attention span and concentration intact, recent and remote memory intact, fund of knowledge intact.  Speech fluent and not dysarthric, language intact.  CN II-XII intact. Bulk and tone normal, muscle strength 5/5 throughout.  Sensation to light touch  intact.  Deep tendon reflexes 2+ throughout.  Finger to nose testing intact.  Gait normal, Romberg negative.  Assessment and Plan:   Seizure disorder, stable.  1.  Lamotrigine 150mg  twice daily 2.  Check CBC and CMP 3.  Follow up in one year   , DO

## 2019-09-22 ENCOUNTER — Encounter: Payer: Self-pay | Admitting: Neurology

## 2019-09-22 ENCOUNTER — Other Ambulatory Visit: Payer: BC Managed Care – PPO

## 2019-09-22 ENCOUNTER — Other Ambulatory Visit: Payer: Self-pay

## 2019-09-22 ENCOUNTER — Ambulatory Visit (INDEPENDENT_AMBULATORY_CARE_PROVIDER_SITE_OTHER): Payer: BC Managed Care – PPO | Admitting: Neurology

## 2019-09-22 VITALS — BP 127/80 | HR 87 | Temp 98.6°F | Ht 67.0 in | Wt 149.0 lb

## 2019-09-22 DIAGNOSIS — G40909 Epilepsy, unspecified, not intractable, without status epilepticus: Secondary | ICD-10-CM | POA: Diagnosis not present

## 2019-09-22 LAB — CBC
HCT: 43.7 % (ref 38.5–50.0)
Hemoglobin: 14.9 g/dL (ref 13.2–17.1)
MCH: 30.9 pg (ref 27.0–33.0)
MCHC: 34.1 g/dL (ref 32.0–36.0)
MCV: 90.7 fL (ref 80.0–100.0)
MPV: 10.2 fL (ref 7.5–12.5)
Platelets: 246 10*3/uL (ref 140–400)
RBC: 4.82 10*6/uL (ref 4.20–5.80)
RDW: 11.9 % (ref 11.0–15.0)
WBC: 6.9 10*3/uL (ref 3.8–10.8)

## 2019-09-22 LAB — COMPLETE METABOLIC PANEL WITH GFR
AG Ratio: 1.7 (calc) (ref 1.0–2.5)
ALT: 14 U/L (ref 9–46)
AST: 13 U/L (ref 10–40)
Albumin: 4.6 g/dL (ref 3.6–5.1)
Alkaline phosphatase (APISO): 73 U/L (ref 36–130)
BUN: 19 mg/dL (ref 7–25)
CO2: 26 mmol/L (ref 20–32)
Calcium: 9.7 mg/dL (ref 8.6–10.3)
Chloride: 102 mmol/L (ref 98–110)
Creat: 1.04 mg/dL (ref 0.60–1.35)
GFR, Est African American: 116 mL/min/{1.73_m2} (ref >=60)
GFR, Est Non African American: 100 mL/min/{1.73_m2} (ref >=60)
Globulin: 2.7 g/dL (calc) (ref 1.9–3.7)
Glucose, Bld: 86 mg/dL (ref 65–99)
Potassium: 4 mmol/L (ref 3.5–5.3)
Sodium: 138 mmol/L (ref 135–146)
Total Bilirubin: 0.6 mg/dL (ref 0.2–1.2)
Total Protein: 7.3 g/dL (ref 6.1–8.1)

## 2019-09-22 MED ORDER — LAMOTRIGINE 150 MG PO TABS: 150.0000 mg | ORAL_TABLET | Freq: Two times a day (BID) | ORAL | 3 refills | Status: DC

## 2019-09-22 NOTE — Patient Instructions (Signed)
1.  Lamotrigine 150mg  twice daily 2.  Check CBC and CMP 3.  Follow up in one year

## 2019-10-28 IMAGING — CT CT HEAD W/O CM
3 of 4 series · 14 of 47 positions shown, 16 images · non-contrast
Comparison: 01/21/2017

CLINICAL DATA: Post seizure with fall and trauma to the posterior
head.

EXAM:
CT HEAD WITHOUT CONTRAST
TECHNIQUE: Contiguous axial images were obtained from the base of the skull
through the vertex without intravenous contrast.

[Series 2: head w/o · axial · non-contrast · 0.49mm/px · z∈[-170,-44]mm · 8 of 31 slices shown, 10 images]
[im 3/31  brain]
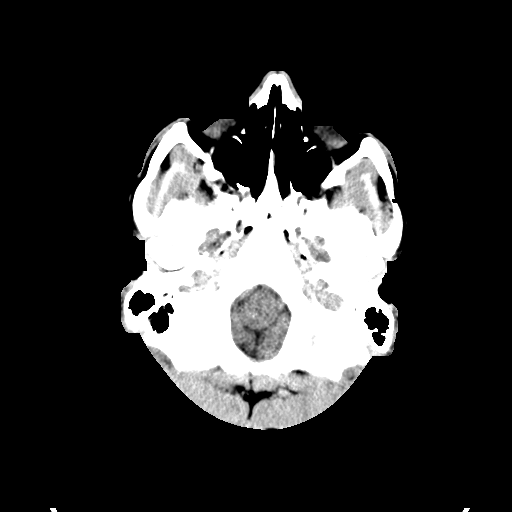
[im 3/31  bone]
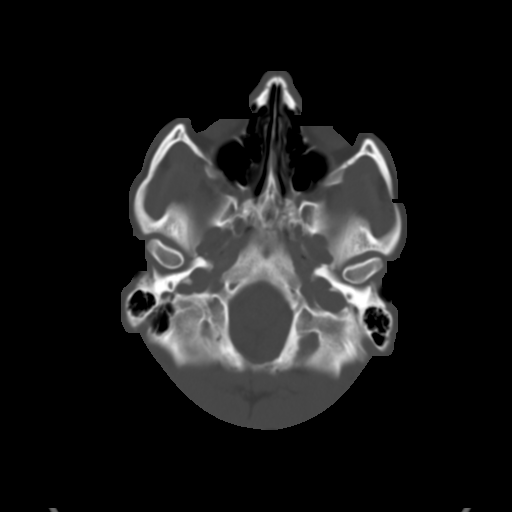
[im 7/31  brain]
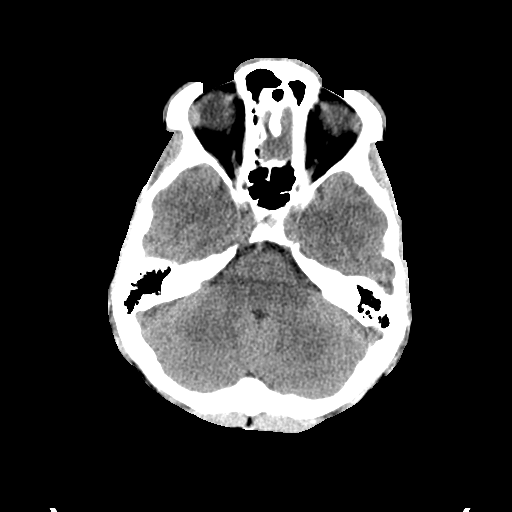
[im 11/31  brain]
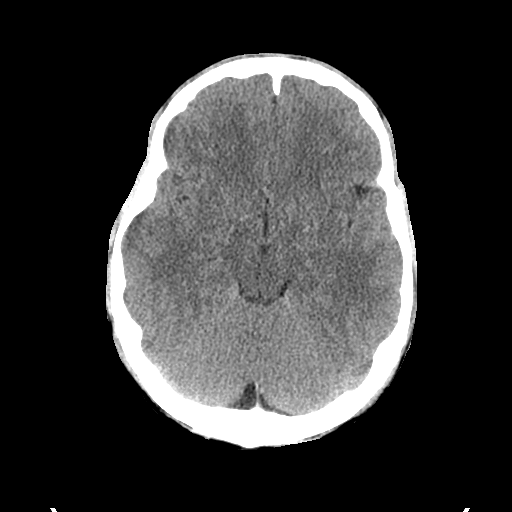
[im 13/31  brain]
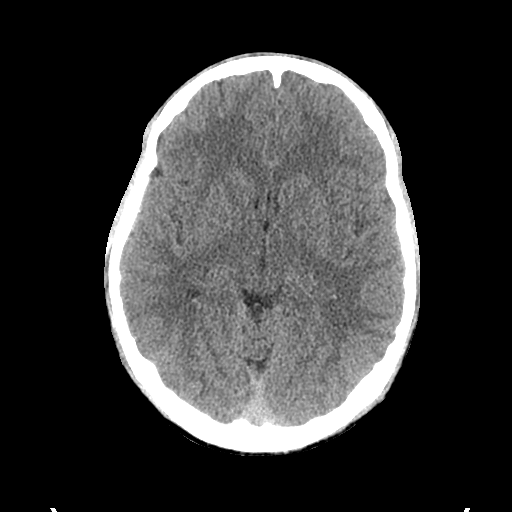
[im 18/31  brain]
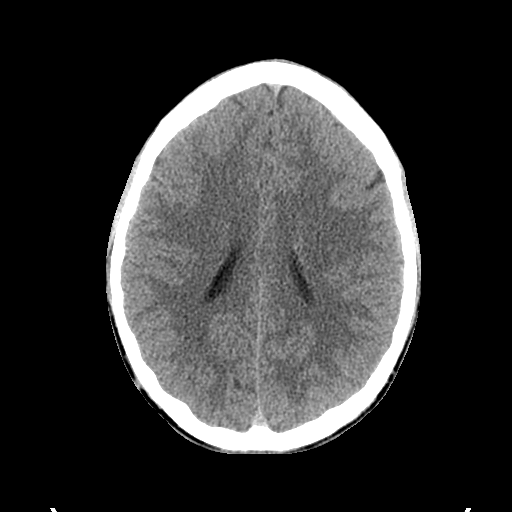
[im 18/31  bone]
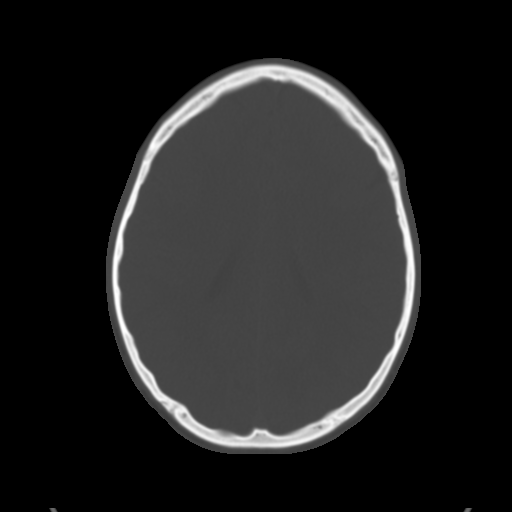
[im 20/31  brain]
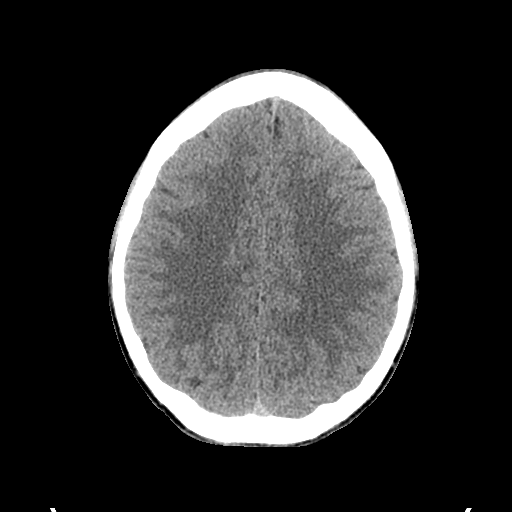
[im 24/31  brain]
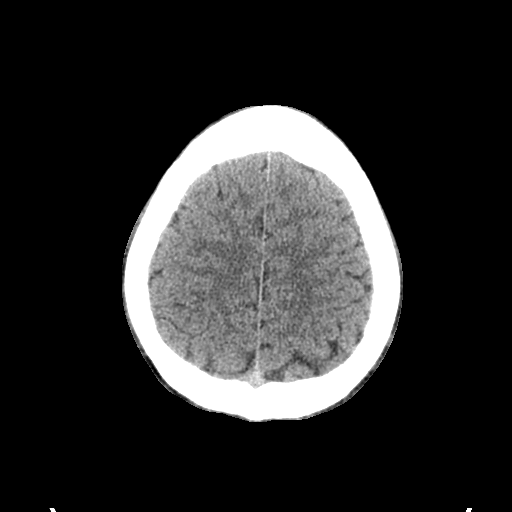
[im 28/31  brain]
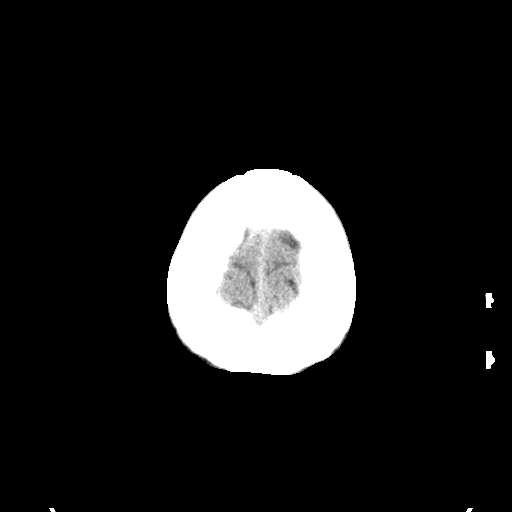

[Series 4: coronal · coronal · 0.30mm/px · 3 of 77 slices shown]
[im 26/77  brain]
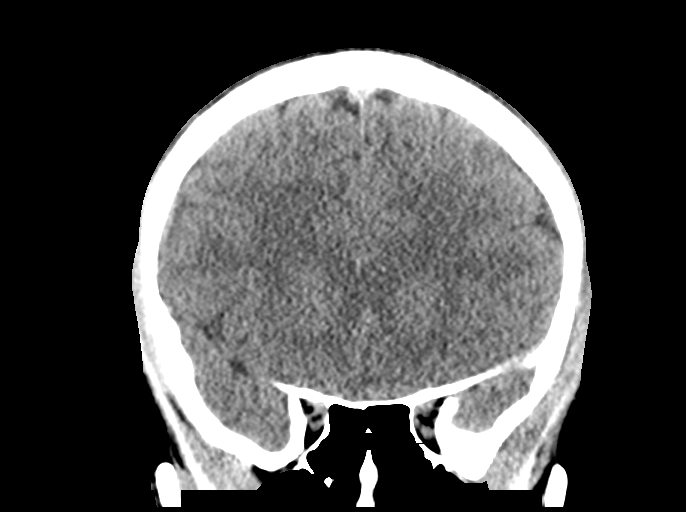
[im 34/77  brain]
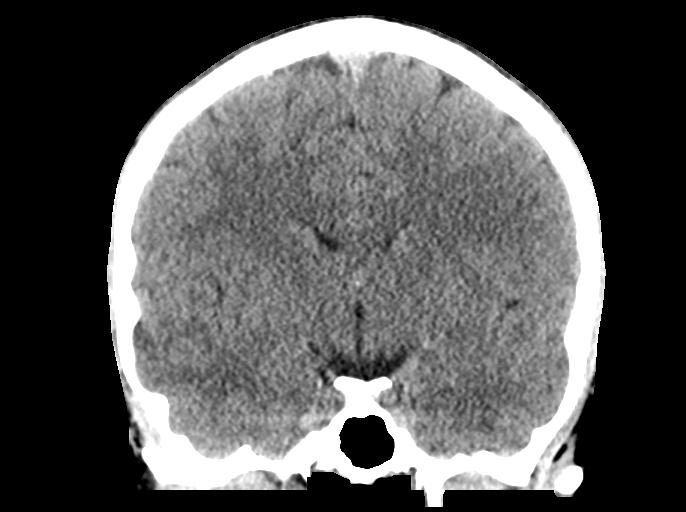
[im 43/77  brain]
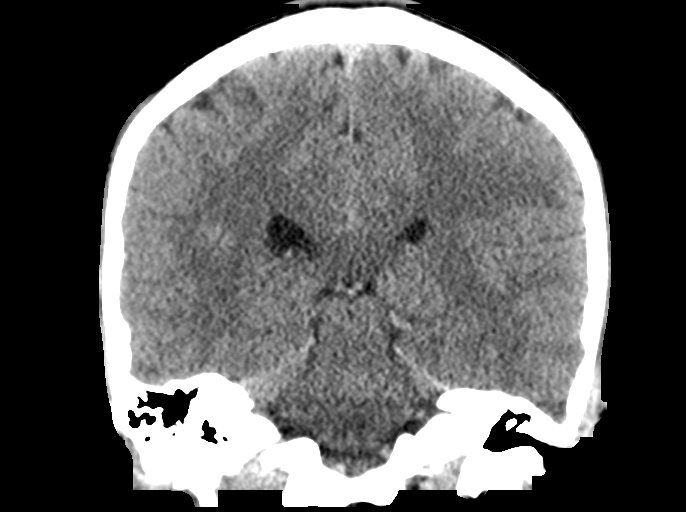

[Series 5: sagittal · sagittal · 0.30mm/px · 3 of 77 slices shown]
[im 26/77  brain]
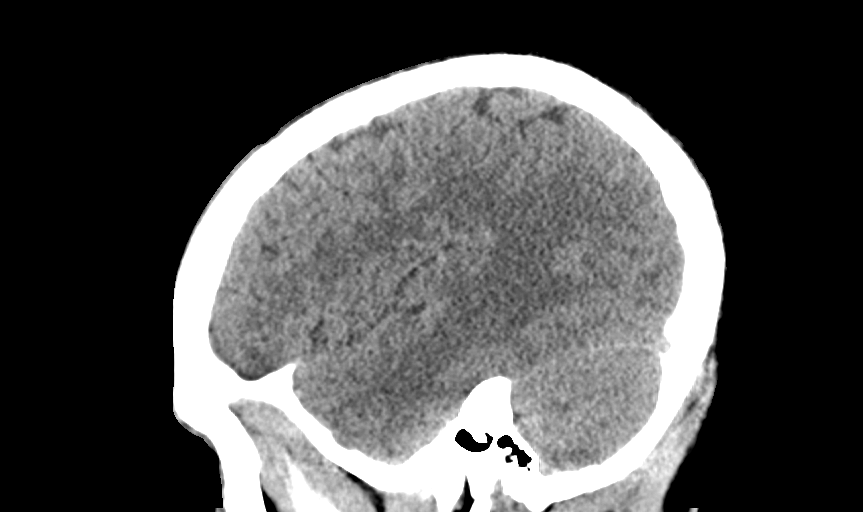
[im 39/77  brain]
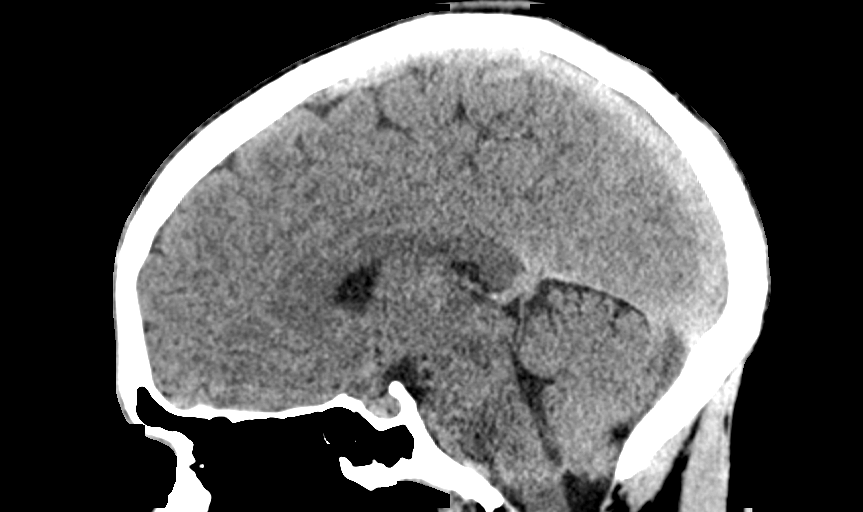
[im 51/77  brain]
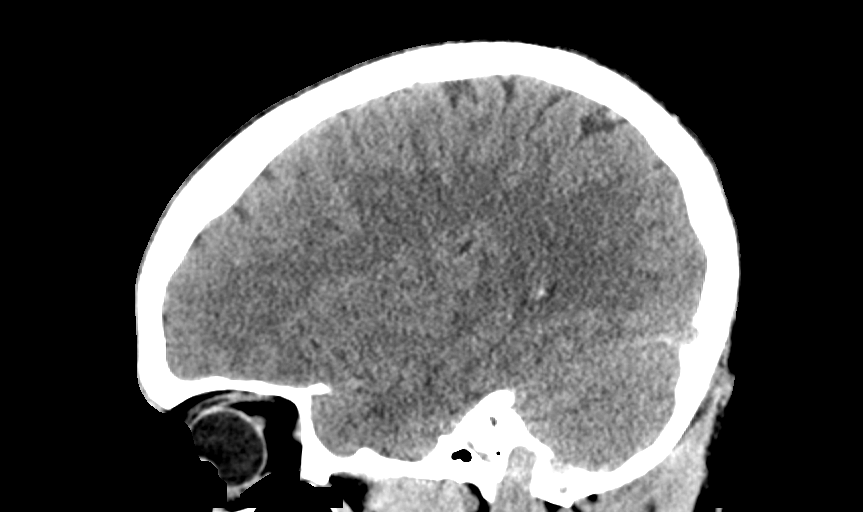

[14 of 47 positions shown; findings below may reference images not displayed]

FINDINGS: Brain: No evidence of acute infarction, hemorrhage, hydrocephalus,
extra-axial collection or mass lesion/mass effect.

Vascular: No hyperdense vessel or unexpected calcification.

Skull: Normal. Negative for fracture or focal lesion.

Sinuses/Orbits: No acute finding.

Other: None.
IMPRESSION: No acute intracranial abnormality.

## 2019-11-20 ENCOUNTER — Telehealth: Payer: Self-pay | Admitting: Neurology

## 2019-11-20 NOTE — Telephone Encounter (Signed)
Pt states last week he had a seizure. The week before that he had some headaches that were minor.  Pt state he think what caused the seizure he missed his medication the day of his seizure.   Pt wanted to let us know that this happened.

## 2019-11-20 NOTE — Telephone Encounter (Signed)
No answer at [phone]

## 2019-11-20 NOTE — Telephone Encounter (Signed)
Patient called and requested a call back from a nurse. He seemed to be emotional and had a hard time expressing himself to me. From what I gathered something happened last week and the patient wants to speak with a nurse to give more information.

## 2019-11-21 NOTE — Telephone Encounter (Signed)
Noted  

## 2020-03-16 ENCOUNTER — Telehealth: Payer: Self-pay | Admitting: Neurology

## 2020-03-16 DIAGNOSIS — Z79899 Other long term (current) drug therapy: Secondary | ICD-10-CM

## 2020-03-16 NOTE — Telephone Encounter (Signed)
I have some questions first:  Did he miss any doses?  Is he possibly taking a pill from a different manufacturer (is the pill he takes now looks different than previous prescriptions)?  Has he been getting less sleep?  Has he been drinking more alcohol?  Before deciding to increase dose, I want to check a lamotrigine trough level.  Further recommendations pending results. He should be aware of [place] law stating no driving for 6 months after a seizure He should make a follow up appointment

## 2020-03-16 NOTE — Telephone Encounter (Signed)
Patient called in and had another incident and needs to know where to go from here.

## 2020-03-16 NOTE — Telephone Encounter (Signed)
Pt states he had another seizure yesterday. Pt wanted to k ow if he should increase his medication. He doesn't want to miss any work.   Please advise.

## 2020-03-17 NOTE — Telephone Encounter (Signed)
I have some questions first: 1.  Did he miss any doses? Yes he missed the day before, But he took it a couple of hours later.  2.  Is he possibly taking a pill from a different manufacturer (is the pill he takes now looks different than previous prescriptions)? NO 3.  Has he been getting less sleep? NO, He getting enough sleep.  4.  Has he been drinking more alcohol? Yes not as much   Before deciding to increase dose, I want to check a lamotrigine trough level.  Further recommendations pending results.  Pt will stop by to get his blood drawn. He should be aware of [place] law stating no driving for 6 months after a seizure He should make a follow up appointment

## 2020-03-25 ENCOUNTER — Telehealth: Payer: Self-pay | Admitting: Neurology

## 2020-03-25 NOTE — Telephone Encounter (Signed)
Pt advised to go get his blood drawn.

## 2020-03-25 NOTE — Telephone Encounter (Signed)
[facility] 03/24/20 @ 4:41pm:  "Caller had a seizure aura and he needs to talk to a nurse about the medication.  Nurse assessment: Caller states he had a seizure aura this morning for a few seconds. States he feels like he needs to have a little more medication to prevent a seizure. Last seizure was about 1.5 weeks ago. Takes Lamotrigine 150mg  BID.  Patient advised from seizure guideline. Advised patient to call back if another seizure occurs. Please call patient regarding medication changes."

## 2020-03-25 NOTE — Telephone Encounter (Signed)
Patient returned call to Sheena. 

## 2020-03-25 NOTE — Telephone Encounter (Signed)
PTried calling pt no answer, LMOVM.     This is another Seizure after speaking the other day. Pt hasn't missed any medication since the last time we spoke.  Should we increase the medication or change?  Pt wanted to know if he missed his medication during the day would it be okay to take both the AM and PM at the same time?  Or will this be too much at once?

## 2020-03-25 NOTE — Telephone Encounter (Signed)
I had requested that a trough lamotrigine level be drawn.  He needs to get this done.

## 2020-03-30 ENCOUNTER — Other Ambulatory Visit: Payer: Self-pay

## 2020-03-30 DIAGNOSIS — Z79899 Other long term (current) drug therapy: Secondary | ICD-10-CM

## 2020-04-01 LAB — LAMOTRIGINE LEVEL: Lamotrigine Lvl: 3.9 ug/mL — ABNORMAL LOW (ref 4.0–18.0)

## 2020-05-18 ENCOUNTER — Telehealth: Payer: Self-pay | Admitting: Neurology

## 2020-05-18 NOTE — Telephone Encounter (Signed)
Patient's mom called wanting to see if the patient can be seen sooner than 09/21/2020.

## 2020-05-18 NOTE — Telephone Encounter (Signed)
Yes, use a work-in slot.

## 2020-05-19 NOTE — Telephone Encounter (Signed)
06/02/20

## 2020-06-01 NOTE — Progress Notes (Deleted)
NEUROLOGY FOLLOW UP OFFICE NOTE  Matthew Barr [id number]   Subjective:  Matthew Barr is a 24 year old right-handed man who follows up for seizures  UPDATE: Current medication: Lamotrigine 150 mg twice daily  In April, he had a seizure.  He reported that he missed his medication that day.  He had another seizure in late August.  He did not miss any medication doses, there was no change in medication manufacturer, he had been sleeping well and he had not been drinking alcohol.  About a week later, he had a seizure aura in which ***.  Trough lamotrigine level from 03/30/2020 was 3.9.  He had not missed any dosing.  Lamotrigine was increased to 200mg  twice daily.  He had another seizure on 05/18/2020.  ***  HISTORY: On 09/04/09, he had a new onset seizure. He was on his bed when he blacked out rolling over onto the floor. His mother found him on the floor convulsing with foam from his mouth but no tongue biting or incontinence. It lasted 60 to 90 seconds. He was postictal afterward. CT of head was unremarkable. He did not follow up with neurology.  On 01/21/17, he was outside grilling when he suddenly dropped to the ground, hitting his head on the grill. He exhibited extensor posturing and shaking. It lasted about a couple of minutes. When he woke up, he was pale and vomited. He was confused and had a headache. He presented to the ED for further evaluation. CT of head was unremarkable. CBC, BMP and EKG were normal. He was discharged with instructions to follow up with neurology. He never followed up with neurology. On 06/02/17, he had a second witnessed seizure. It was preceded by an undescribable aura for a few seconds prior to passing out. He was postictal for about 10 to 15 minutes. Afterwards, he was nauseous with 2 episodes of vomiting. He returned to the ED where repeat head CT was unremarkable. CBC and BMP were unremarkable. He was started on a Lamictal titration later  that month. On 07/10/17, he had reached Lamical titration of 50mg  twice daily and Keppra was discontinued. The following day, he had another seizure. In the ED, CT of head was normal. He reported decreased sleep and increased caffeine intake. He was restarted on Keppra at 500mg  twice daily. Lamictal was further titrated to 100mg  twice daily and Keppra was again discontinued.  Workup: One hour sleep deprived EEG from 06/18/17 was normal. MRI of brain without contrast with seizure protocol from 06/27/17 was personally reviewed and was normal.  He was delivered via C-section because he was breech, but otherwise uncomplicated birth. He had no history of febrile seizures, meningitis/encephalitis, head trauma. His father may have had a questionable seizure while sick with the flu. His maternal grandmother's uncle reportedly had epilepsy.  PAST MEDICAL HISTORY: Past Medical History:  Diagnosis Date  . Epilepsy ([facility]) 2018  . Seizures ([facility])     MEDICATIONS: Current Outpatient Medications on File Prior to Visit  Medication Sig Dispense Refill  . fluconazole (DIFLUCAN) 200 MG tablet Take 1 tablet (200 mg total) by mouth daily. (Patient not taking: Reported on 09/22/2019) 3 tablet 0  . ibuprofen (ADVIL) 200 MG tablet Take 400 mg by mouth as needed.    . lamoTRIgine (LAMICTAL) 150 MG tablet Take 1 tablet (150 mg total) by mouth 2 (two) times daily. 180 tablet 3  . Multiple Vitamin (MULTIVITAMIN) tablet Take 1 tablet by mouth once a week.     No  current facility-administered medications on file prior to visit.    ALLERGIES: No Known Allergies  FAMILY HISTORY: No family history on file. ***.  SOCIAL HISTORY: Social History   Socioeconomic History  . Marital status: Single    Spouse name: Not on file  . Number of children: 0  . Years of education: some college  . Highest education level: Some college, no degree  Occupational History  . Occupation: Conservation officer, nature  Tobacco Use  . Smoking  status: Never Smoker  . Smokeless tobacco: Never Used  Vaping Use  . Vaping Use: Never used  Substance and Sexual Activity  . Alcohol use: Yes    Comment: 2-3 beers a month  . Drug use: No  . Sexual activity: Not Currently  Other Topics Concern  . Not on file  Social History Narrative   Single, lives with parents in 2 story home,, is student, pursueing communications degree. Has 3 dogs, two cats. Drinks 2 cups coffee a day. Exercises every other day.   Social Determinants of Health   Financial Resource Strain:   . Difficulty of Paying Living Expenses: Not on file  Food Insecurity:   . Worried About Programme researcher, broadcasting/film/video in the Last Year: Not on file  . Ran Out of Food in the Last Year: Not on file  Transportation Needs:   . Lack of Transportation (Medical): Not on file  . Lack of Transportation (Non-Medical): Not on file  Physical Activity:   . Days of Exercise per Week: Not on file  . Minutes of Exercise per Session: Not on file  Stress:   . Feeling of Stress : Not on file  Social Connections:   . Frequency of Communication with Friends and Family: Not on file  . Frequency of Social Gatherings with Friends and Family: Not on file  . Attends Religious Services: Not on file  . Active Member of Clubs or Organizations: Not on file  . Attends Banker Meetings: Not on file  . Marital Status: Not on file  Intimate Partner Violence:   . Fear of Current or Ex-Partner: Not on file  . Emotionally Abused: Not on file  . Physically Abused: Not on file  . Sexually Abused: Not on file     Objective:   *** General: No acute distress.  Patient appears well-groomed.   Head:  Normocephalic/atraumatic Eyes:  Fundi examined but not visualized Neck: supple, no paraspinal tenderness, full range of motion Heart:  Regular rate and rhythm Lungs:  Clear to auscultation bilaterally Back: No paraspinal tenderness Neurological Exam: alert and oriented to person, place, and time.  Attention span and concentration intact, recent and remote memory intact, fund of knowledge intact.  Speech fluent and not dysarthric, language intact.  CN II-XII intact. Bulk and tone normal, muscle strength 5/5 throughout.  Sensation to light touch, temperature and vibration intact.  Deep tendon reflexes 2+ throughout, toes downgoing.  Finger to nose and heel to shin testing intact.  Gait normal, Romberg negative.   Assessment/Plan:   Seizure disorder.  Experiencing breakthrough seizures.  No apparent cause identified.    ***  Matthew Millet, DO

## 2020-06-02 ENCOUNTER — Ambulatory Visit: Payer: Self-pay | Admitting: Neurology

## 2020-06-03 ENCOUNTER — Other Ambulatory Visit: Payer: Self-pay

## 2020-06-03 ENCOUNTER — Other Ambulatory Visit: Payer: Self-pay | Admitting: Neurology

## 2020-06-03 ENCOUNTER — Ambulatory Visit: Payer: Self-pay | Admitting: Neurology

## 2020-06-03 ENCOUNTER — Encounter: Payer: Self-pay | Admitting: Neurology

## 2020-06-03 ENCOUNTER — Telehealth: Payer: Self-pay | Admitting: Neurology

## 2020-06-03 VITALS — BP 118/76 | HR 97 | Ht 66.0 in | Wt 153.0 lb

## 2020-06-03 DIAGNOSIS — G40909 Epilepsy, unspecified, not intractable, without status epilepticus: Secondary | ICD-10-CM

## 2020-06-03 MED ORDER — LEVETIRACETAM 500 MG PO TABS: 500.0000 mg | ORAL_TABLET | Freq: Two times a day (BID) | ORAL | 5 refills | Status: DC

## 2020-06-03 MED ORDER — LAMOTRIGINE 200 MG PO TABS: 200.0000 mg | ORAL_TABLET | Freq: Two times a day (BID) | ORAL | 5 refills | Status: DC

## 2020-06-03 MED ORDER — LAMOTRIGINE 200 MG PO TBDP: 200.0000 mg | ORAL_TABLET | Freq: Two times a day (BID) | ORAL | 5 refills | Status: DC

## 2020-06-03 NOTE — Patient Instructions (Addendum)
1.  Continue lamotrigine 200mg  twice daily 2.  Start levetiracetam 500mg  twice daily 3.  Check MRI of brain with and without contrast with seizure protocol.  We have sent a referral to [facility] for your MRI and they will call you directly to schedule your appointment. They are located at [address] [facility]. If you need to contact them directly please call [phone].  4.  Check CBC, CMP and lamotrigine level 5.  Follow up 4 months.

## 2020-06-03 NOTE — Progress Notes (Signed)
NEUROLOGY FOLLOW UP OFFICE NOTE  Matthew Barr [id number]   Subjective:  Matthew Barr is a 24 year old right-handed man who follows up for seizures.  He is accompanied by his mother who supplements history.  UPDATE: Current medication: Lamotrigine 200 mg twice daily  In April, he had a seizure.  He reported that he missed his medication that day.  He had another seizure in late August.  He did not miss any medication doses, there was no change in medication manufacturer, he had been sleeping well.  He has maybe two drinks a week.  About a week later, he had a seizure aura in which lasted an hour.  Trough lamotrigine level from 03/30/2020 was 3.9.  He had not missed any dosing.  Lamotrigine was increased to 200mg  twice daily.  He had another seizure on 05/18/2020. Since then, he has had another migraine aura last week with no loss of consciousness.  Due to these seizures, he is no longer working.  HISTORY: On 09/04/09, he had a new onset seizure. He was on his bed when he blacked out rolling over onto the floor. His mother found him on the floor convulsing with foam from his mouth but no tongue biting or incontinence. It lasted 60 to 90 seconds. He was postictal afterward. CT of head was unremarkable. He did not follow up with neurology.  On 01/21/17, he was outside grilling when he suddenly dropped to the ground, hitting his head on the grill. He exhibited extensor posturing and shaking. It lasted about a couple of minutes. When he woke up, he was pale and vomited. He was confused and had a headache. He presented to the ED for further evaluation. CT of head was unremarkable. CBC, BMP and EKG were normal. He was discharged with instructions to follow up with neurology. He never followed up with neurology. On 06/02/17, he had a second witnessed seizure. It was preceded by an undescribable aura for a few seconds prior to passing out. He was postictal for about 10 to 15 minutes.  Afterwards, he was nauseous with 2 episodes of vomiting. He returned to the ED where repeat head CT was unremarkable. CBC and BMP were unremarkable. He was started on a Lamictal titration later that month. On 07/10/17, he had reached Lamical titration of 50mg  twice daily and Keppra was discontinued. The following day, he had another seizure. In the ED, CT of head was normal. He reported decreased sleep and increased caffeine intake. He was restarted on Keppra at 500mg  twice daily. Lamictal was further titrated to 100mg  twice daily and Keppra was again discontinued.  Workup: One hour sleep deprived EEG from 06/18/17 was normal. MRI of brain without contrast with seizure protocol from 06/27/17 was personally reviewed and was normal.  He was delivered via C-section because he was breech, but otherwise uncomplicated birth. He had no history of febrile seizures, meningitis/encephalitis, head trauma. His father may have had a questionable seizure while sick with the flu. His maternal grandmother's uncle reportedly had epilepsy.  PAST MEDICAL HISTORY: Past Medical History:  Diagnosis Date  . Epilepsy (HCC) 2018  . Seizures (HCC)     MEDICATIONS: Current Outpatient Medications on File Prior to Visit  Medication Sig Dispense Refill  . fluconazole (DIFLUCAN) 200 MG tablet Take 1 tablet (200 mg total) by mouth daily. (Patient not taking: Reported on 09/22/2019) 3 tablet 0  . ibuprofen (ADVIL) 200 MG tablet Take 400 mg by mouth as needed.    . lamoTRIgine (LAMICTAL) 150  MG tablet Take 1 tablet (150 mg total) by mouth 2 (two) times daily. 180 tablet 3  . Multiple Vitamin (MULTIVITAMIN) tablet Take 1 tablet by mouth once a week.     No current facility-administered medications on file prior to visit.    ALLERGIES: No Known Allergies  FAMILY HISTORY: No family history on file. .  SOCIAL HISTORY: Social History   Socioeconomic History  . Marital status: Single    Spouse name: Not on  file  . Number of children: 0  . Years of education: some college  . Highest education level: Some college, no degree  Occupational History  . Occupation: Conservation officer, nature  Tobacco Use  . Smoking status: Never Smoker  . Smokeless tobacco: Never Used  Vaping Use  . Vaping Use: Never used  Substance and Sexual Activity  . Alcohol use: Yes    Comment: 2-3 beers a month  . Drug use: No  . Sexual activity: Not Currently  Other Topics Concern  . Not on file  Social History Narrative   Single, lives with parents in 2 story home,, is student, pursueing communications degree. Has 3 dogs, two cats. Drinks 2 cups coffee a day. Exercises every other day.   Social Determinants of Health   Financial Resource Strain:   . Difficulty of Paying Living Expenses: Not on file  Food Insecurity:   . Worried About Programme researcher, broadcasting/film/video in the Last Year: Not on file  . Ran Out of Food in the Last Year: Not on file  Transportation Needs:   . Lack of Transportation (Medical): Not on file  . Lack of Transportation (Non-Medical): Not on file  Physical Activity:   . Days of Exercise per Week: Not on file  . Minutes of Exercise per Session: Not on file  Stress:   . Feeling of Stress : Not on file  Social Connections:   . Frequency of Communication with Friends and Family: Not on file  . Frequency of Social Gatherings with Friends and Family: Not on file  . Attends Religious Services: Not on file  . Active Member of Clubs or Organizations: Not on file  . Attends Banker Meetings: Not on file  . Marital Status: Not on file  Intimate Partner Violence:   . Fear of Current or Ex-Partner: Not on file  . Emotionally Abused: Not on file  . Physically Abused: Not on file  . Sexually Abused: Not on file     Objective:   Blood pressure 118/76, pulse 97, height 5\' 6"  (1.676 m), weight 153 lb (69.4 kg), SpO2 98 %. General: No acute distress.  Patient appears well-groomed.   Head:   Normocephalic/atraumatic Eyes:  Fundi examined but not visualized Neck: supple, no paraspinal tenderness, full range of motion Heart:  Regular rate and rhythm Lungs:  Clear to auscultation bilaterally Back: No paraspinal tenderness Neurological Exam: alert and oriented to person, place, and time. Attention span and concentration intact, recent and remote memory intact, fund of knowledge intact.  Speech fluent and not dysarthric, language intact.  CN II-XII intact. Bulk and tone normal, muscle strength 5/5 throughout.  Sensation to light touch, temperature and vibration intact.  Deep tendon reflexes 2+ throughout, toes downgoing.  Finger to nose and heel to shin testing intact.  Gait normal, Romberg negative.   Assessment/Plan:   1.  Seizure disorder.  Experiencing breakthrough seizures.  No apparent cause identified.    1.  Will check MRI of brain with and without contrast  2.  Start Keppra 500mg  twice daily 3.  Continue Lamictal 200mg  twice daily 4.  Check CBC, CMP and lamotrigine level 5.  No driving for 6 months from last seizure with LOC (10/26), as per [facility].    6.  No swimming alone 7.  Advised to discontinue alcohol. 8.  Follow up in 4 months.    11-09-1990, DO

## 2020-06-03 NOTE — Telephone Encounter (Signed)
Done

## 2020-06-21 ENCOUNTER — Telehealth: Payer: Self-pay | Admitting: Neurology

## 2020-06-21 DIAGNOSIS — G40909 Epilepsy, unspecified, not intractable, without status epilepticus: Secondary | ICD-10-CM

## 2020-06-21 NOTE — Telephone Encounter (Signed)
Left detailed a Message for pt. Sleep deprived EEG ordered per DR.Everlena Cooper

## 2020-06-21 NOTE — Telephone Encounter (Signed)
Please send new prescription for increased Keppra to 1000mg  twice daily.  I want to check a sleep-deprived EEG  I would really like him to get the MRI of brain with and without contrast ASAP

## 2020-06-22 NOTE — Telephone Encounter (Signed)
Advised pt of  Dr.Jaffe.   Pt should continue on the Keppra 500 mg twice a day for the next 6 weeks and give [place] an update after to see if we should increase or stay at the current dose.

## 2020-06-22 NOTE — Telephone Encounter (Signed)
He should continue the Keppra.  Keppra is specifically for treating seizures, it won't make seizures worse.  If he is having multiple seizures then I would rather continue the medications that I am prescribing so I can try to get them under control first before we decide to make changes.  I also want it known that if he is suddenly having frequent seizures, I would recommend MRI of brain ASAP

## 2020-06-22 NOTE — Telephone Encounter (Signed)
Telephone call from pt, Pt states the day after starting the Keppra he noticed that he had some Aura's. He stop taking the Keppra for 4 days now.  Pt wanted to know if he should continue taking the Keppra or not take it any more?  Pt reporting having another seziure yesterday around 1 pm. Pt is aware that it could be from him stopping the Keppra.   Pt feels that he is unable to do Sleep deprived EEG right now. He declined scheduling it with the front desk. As for the MRI, Pt states he will see what he can do since he want have insurance for another two months.

## 2020-07-01 ENCOUNTER — Telehealth: Payer: Self-pay | Admitting: Neurology

## 2020-07-01 MED ORDER — LEVETIRACETAM 750 MG PO TABS
750.0000 mg | ORAL_TABLET | Freq: Two times a day (BID) | ORAL | 0 refills | Status: DC
Start: 2020-07-01 — End: 2020-11-01

## 2020-07-01 NOTE — Telephone Encounter (Signed)
Pt called and informed If he continues to be having auras, Dr Everlena Cooper want to increase levetiracetam to 750mg  twice daily. Also discontinue alcohol intake indefinitely as this may contribute to ongoing auras. Pt verbalized understanding,  New script called into pharmacy

## 2020-07-01 NOTE — Telephone Encounter (Signed)
If he continues to be having auras, I want to increase levetiracetam to 750mg  twice daily.  I would discontinue alcohol intake indefinitely as this may contribute to ongoing auras.

## 2020-07-01 NOTE — Telephone Encounter (Signed)
Pt called left a voice mail for him to call the office back

## 2020-07-13 ENCOUNTER — Telehealth: Payer: Self-pay | Admitting: Neurology

## 2020-07-13 ENCOUNTER — Telehealth: Payer: Self-pay

## 2020-07-13 NOTE — Telephone Encounter (Signed)
Patient called again requesting a call back from a nurse.

## 2020-07-13 NOTE — Telephone Encounter (Signed)
I don't want to make any changes to the seizure medications because I really don't believe that they are causing seizure auras.  However, I do want to check a 48 hour ambulatory EEG to capture these episodes.

## 2020-07-13 NOTE — Telephone Encounter (Signed)
Fyi patient wanted to let you know 30 minutes after he takes his Keppra and Lamotrigine. He has an aura on occ not strong he states, feels fine today, just fyi.

## 2020-07-13 NOTE — Telephone Encounter (Signed)
Tired to return call no answer at [phone] 07/13/2020. Need to find out what medication he is having problems with.

## 2020-07-13 NOTE — Telephone Encounter (Signed)
Patient will call back once he gets insurance at a later date, unable to do it at that this time. fyi

## 2020-09-21 ENCOUNTER — Ambulatory Visit: Payer: BC Managed Care – PPO | Admitting: Neurology

## 2020-10-26 NOTE — Progress Notes (Signed)
NEUROLOGY FOLLOW UP OFFICE NOTE  Matthew Barr [id number]  Assessment/Plan:   1.  Localization-related epilepsy - last full seizure September 2021 2.  Mild altered sensorium - difficult to determine if mild auras or related to anxiety - I would really like to get an EEG  1.  I would like to order the following tests: -  MRI of brain with and without contrast -  Routine EEG, followed by ambulatory EEG if needed. -  Lamotrigine and levetiracetam levels He is still without insurance but says he should have it in the next couple of months.  Once he has coverage, he will contact us so that we can set up these tests. 2.  Otherwise, follow up 6 months.  Subjective:  Matthew Barr is a 25 year old right-handed man who follows up for seizure disorder.Marland Kitchen  He is accompanied by his mother who supplements history.  UPDATE: Current medication: Lamotrigine 200 mg twice daily, Keppra 750mg  twice daily.  Due to increased seizure frequency, he was restarted on Keppra 500mg  twice daily.  MRI of brain and EEG were ordered but patient did not yet have insurance so he deferred.  He continued to have auras (deja vu sensation with difficulty focusing), so Keppra was increased to 750mg  twice daily in December.  He has not had any heavy auras since then but has brief 30 second episodes of feeling off - occurs once a day to every other day.  Notes having sensation of fear at times.  Last seizure was in September 2021.  HISTORY: On 09/04/09, he had a new onset seizure. He was on his bed when he blacked out rolling over onto the floor. His mother found him on the floor convulsing with foam from his mouth but no tongue biting or incontinence. It lasted 60 to 90 seconds. He was postictal afterward. CT of head was unremarkable. He did not follow up with neurology.  On 01/21/17, he was outside grilling when he suddenly dropped to the ground, hitting his head on the grill. He exhibited extensor posturing and  shaking. It lasted about a couple of minutes. When he woke up, he was pale and vomited. He was confused and had a headache. He presented to the ED for further evaluation. CT of head was unremarkable. CBC, BMP and EKG were normal. He was discharged with instructions to follow up with neurology. He never followed up with neurology. On 06/02/17, he had a second witnessed seizure. It was preceded by an undescribable aura for a few seconds prior to passing out. He was postictal for about 10 to 15 minutes. Afterwards, he was nauseous with 2 episodes of vomiting. He returned to the ED where repeat head CT was unremarkable. CBC and BMP were unremarkable. He was started on a Lamictal titration later that month. On 07/10/17, he had reached Lamical titration of 50mg  twice daily and Keppra was discontinued. The following day, he had another seizure. In the ED, CT of head was normal. He reported decreased sleep and increased caffeine intake. He was restarted on Keppra at 500mg  twice daily. Lamictal was further titrated to 100mg  twice daily and Keppra was again discontinued.  In April 2021, he had a seizure.  He reported that he missed his medication that day.  He had another seizure in late August.  He did not miss any medication doses, there was no change in medication manufacturer, he had been sleeping well.  He has maybe two drinks a week.  About a week later, he  had a seizure aura in which lasted an hour.  Trough lamotrigine level from 03/30/2020 was 3.9.  He had not missed any dosing.  Lamotrigine was increased to 200mg  twice daily.  He had another seizure on 05/18/2020. Following this seizure, he has had another  aura last week with no loss of consciousness.  Workup: One hour sleep deprived EEG from 06/18/17 was normal. MRI of brain without contrast with seizure protocol from 06/27/17 was personally reviewed and was normal.  He was delivered via C-section because he was breech, but otherwise  uncomplicated birth. He had no history of febrile seizures, meningitis/encephalitis, head trauma. His father may have had a questionable seizure while sick with the flu. His maternal grandmother's uncle reportedly had epilepsy.  Sister - past month wake up and unilateral numbness 2-3 times  PAST MEDICAL HISTORY: Past Medical History:  Diagnosis Date  . Epilepsy (HCC) 2018  . Seizures (HCC)     MEDICATIONS: Current Outpatient Medications on File Prior to Visit  Medication Sig Dispense Refill  . ibuprofen (ADVIL) 200 MG tablet Take 400 mg by mouth as needed.    . lamoTRIgine (LAMICTAL) 200 MG tablet Take 1 tablet (200 mg total) by mouth 2 (two) times daily. 60 tablet 5  . levETIRAcetam (KEPPRA) 750 MG tablet Take 1 tablet (750 mg total) by mouth 2 (two) times daily. 180 tablet 0  . Multiple Vitamin (MULTIVITAMIN) tablet Take 1 tablet by mouth once a week.     No current facility-administered medications on file prior to visit.    ALLERGIES: No Known Allergies  FAMILY HISTORY: No family history on file.    Objective:  Blood pressure 103/72, pulse 77, height 5\' 7"  (1.702 m), weight 146 lb 6.4 oz (66.4 kg), SpO2 98 %. General: No acute distress.  Patient appears well-groomed.   Head:  Normocephalic/atraumatic Eyes:  Fundi examined but not visualized Neck: supple, no paraspinal tenderness, full range of motion Heart:  Regular rate and rhythm Lungs:  Clear to auscultation bilaterally Back: No paraspinal tenderness Neurological Exam: alert and oriented to person, place, and time. Attention span and concentration intact, recent and remote memory intact, fund of knowledge intact.  Speech fluent and not dysarthric, language intact.  CN II-XII intact. Bulk and tone normal, muscle strength 5/5 throughout.  Sensation to light touch, temperature and vibration intact.  Deep tendon reflexes 2+ throughout, toes downgoing.  Finger to nose and heel to shin testing intact.  Gait normal, Romberg  negative.     2019, DO

## 2020-10-27 ENCOUNTER — Ambulatory Visit (INDEPENDENT_AMBULATORY_CARE_PROVIDER_SITE_OTHER): Payer: Self-pay | Admitting: Neurology

## 2020-10-27 ENCOUNTER — Other Ambulatory Visit: Payer: Self-pay

## 2020-10-27 ENCOUNTER — Encounter: Payer: Self-pay | Admitting: Neurology

## 2020-10-27 VITALS — BP 103/72 | HR 77 | Ht 67.0 in | Wt 146.4 lb

## 2020-10-27 DIAGNOSIS — G40001 Localization-related (focal) (partial) idiopathic epilepsy and epileptic syndromes with seizures of localized onset, not intractable, with status epilepticus: Secondary | ICD-10-CM | POA: Insufficient documentation

## 2020-10-27 DIAGNOSIS — G40209 Localization-related (focal) (partial) symptomatic epilepsy and epileptic syndromes with complex partial seizures, not intractable, without status epilepticus: Secondary | ICD-10-CM

## 2020-10-27 DIAGNOSIS — Z79899 Other long term (current) drug therapy: Secondary | ICD-10-CM

## 2020-10-27 NOTE — Patient Instructions (Addendum)
1.  Continue lamotrigine 200mg  twice daily and levetiracetam 750mg  twice daily 2.  Check lamotrigine and Keppra level. 3.  Contact when you have insurance so that we can order MRI of brain and EEG 4.  Follow up 6 months.

## 2020-10-31 ENCOUNTER — Other Ambulatory Visit: Payer: Self-pay | Admitting: Neurology

## 2020-11-01 ENCOUNTER — Other Ambulatory Visit: Payer: Self-pay

## 2020-11-01 ENCOUNTER — Telehealth: Payer: Self-pay | Admitting: Neurology

## 2020-11-01 ENCOUNTER — Other Ambulatory Visit: Payer: Self-pay | Admitting: Neurology

## 2020-11-01 MED ORDER — LAMOTRIGINE 200 MG PO TABS
200.0000 mg | ORAL_TABLET | Freq: Two times a day (BID) | ORAL | 0 refills | Status: DC
Start: 2020-11-01 — End: 2021-06-29

## 2020-11-01 MED ORDER — LEVETIRACETAM 750 MG PO TABS
750.0000 mg | ORAL_TABLET | Freq: Two times a day (BID) | ORAL | 0 refills | Status: DC
Start: 2020-11-01 — End: 2021-04-28

## 2020-11-01 NOTE — Telephone Encounter (Signed)
Refill sent in

## 2021-04-27 NOTE — Progress Notes (Signed)
NEUROLOGY FOLLOW UP OFFICE NOTE  Matthew Barr [id number]  Assessment/Plan:   Focal epilepsy with impaired consciousness  1  Increase levetiracetam to 1000mg  twice daily 2.  Continue lamotrigine 200mg  twice daily 3.  When he has insurance, would repeat MRI of brain, EEG and medication levels. 4.  No driving 5.  Follow up 6 months.  Subjective:  Matthew Barr is a 25 year old right-handed man who follows up for seizure disorder.[name]  He is accompanied by his mother who supplements history.   UPDATE:  Current medication: Lamotrigine 200 mg twice daily, Keppra 750mg  twice daily.   Due to frequent auras, MRI of brain, EEG and lamotrigine/levetiracetam levels were ordered but patient did not yet have insurance so he deferred.  A month ago, he reportedly ran into his parents room at 11 PM at night and passed out on the floor.  He wasn't asleep at the time.  He reportedly extensor posturing and yelled.  Lasting a couple of minutes.  He was fatigued and sore the entire next day.  Has not missed any medications recently.  This was the first full seizure since September 2021.  He still has an aura twice a week (feels "off").  Doesn't feel comfortable to drive.     HISTORY: On 09/04/09, he had a new onset seizure.  He was on his bed when he blacked out rolling over onto the floor.  His mother found him on the floor convulsing with foam from his mouth but no tongue biting or incontinence.  It lasted 60 to 90 seconds.  He was postictal afterward.  CT of head was unremarkable.  He did not follow up with neurology.   On 01/21/17, he was outside grilling when he suddenly dropped to the ground, hitting his head on the grill.  He exhibited extensor posturing and shaking.  It lasted about a couple of minutes.  When he woke up, he was pale and vomited.  He was confused and had a headache.  He presented to the ED for further evaluation.  CT of head was unremarkable.  CBC, BMP and EKG were normal.  He was  discharged with instructions to follow up with neurology.  He never followed up with neurology.  On 06/02/17, he had a second witnessed seizure.  It was preceded by an undescribable aura for a few seconds prior to passing out.  He was postictal for about 10 to 15 minutes.  Afterwards, he was nauseous with 2 episodes of vomiting.  He returned to the ED where repeat head CT was unremarkable.  CBC and BMP were unremarkable.  He was started on a Lamictal titration later that month.  On 07/10/17, he had reached Lamical titration of 50mg  twice daily and Keppra was discontinued.  The following day, he had another seizure.  In the ED, CT of head was normal.  He reported decreased sleep and increased caffeine intake.  He was restarted on Keppra at 500mg  twice daily.  Lamictal was further titrated to 100mg  twice daily and Keppra was again discontinued.   In April 2021, he had a seizure.  He reported that he missed his medication that day.  He had another seizure in late August.  He did not miss any medication doses, there was no change in medication manufacturer, he had been sleeping well.  He has maybe two drinks a week.  About a week later, he had a seizure aura in which lasted an hour.  Trough lamotrigine level from 03/30/2020 was 3.9.  He had not missed any dosing.  Lamotrigine was increased to 200mg  twice daily.  He had another seizure on 05/18/2020. Following this seizure, he has had another  aura last week with no loss of consciousness.  He continues to have occasional auras described as deja vu sensation with trouble focusing or feeling just "off" without loss of consciousness.   Workup: One hour sleep deprived EEG from 06/18/17 was normal. MRI of brain without contrast with seizure protocol from 06/27/17 was personally reviewed and was normal.   He was delivered via C-section because he was breech, but otherwise uncomplicated birth.  He had no history of febrile seizures, meningitis/encephalitis, head trauma.  His father may have had a questionable seizure while sick with the flu.  His maternal grandmother's uncle reportedly had epilepsy.   Sister - past month wake up and unilateral numbness 2-3 times  PAST MEDICAL HISTORY: Past Medical History:  Diagnosis Date   Epilepsy (HCC) 2018   Seizures (HCC)     MEDICATIONS: Current Outpatient Medications on File Prior to Visit  Medication Sig Dispense Refill   ibuprofen (ADVIL) 200 MG tablet Take 400 mg by mouth as needed.     lamoTRIgine (LAMICTAL) 200 MG tablet Take 1 tablet (200 mg total) by mouth 2 (two) times daily. 180 tablet 0   levETIRAcetam (KEPPRA) 750 MG tablet Take 1 tablet (750 mg total) by mouth 2 (two) times daily. 180 tablet 0   Multiple Vitamin (MULTIVITAMIN) tablet Take 1 tablet by mouth once a week.     No current facility-administered medications on file prior to visit.    ALLERGIES: No Known Allergies  FAMILY HISTORY: No family history on file.    Objective:  Blood pressure 119/63, pulse (!) 104, height 5\' 7"  (1.702 m), weight 146 lb 12.8 oz (66.6 kg), SpO2 94 %. General: No acute distress.  Patient appears well-groomed.   Head:  Normocephalic/atraumatic Eyes:  Fundi examined but not visualized Neck: supple, no paraspinal tenderness, full range of motion Heart:  Regular rate and rhythm Lungs:  Clear to auscultation bilaterally Back: No paraspinal tenderness Neurological Exam: alert and oriented to person, place, and time.  Speech fluent and not dysarthric, language intact.  CN II-XII intact. Bulk and tone normal, muscle strength 5/5 throughout.  Sensation to light touch intact.  Deep tendon reflexes 2+ throughout, toes downgoing.  Finger to nose testing intact.  Gait normal, Romberg negative.   2019, DO

## 2021-04-28 ENCOUNTER — Encounter: Payer: Self-pay | Admitting: Neurology

## 2021-04-28 ENCOUNTER — Ambulatory Visit (INDEPENDENT_AMBULATORY_CARE_PROVIDER_SITE_OTHER): Payer: Self-pay | Admitting: Neurology

## 2021-04-28 ENCOUNTER — Other Ambulatory Visit: Payer: Self-pay

## 2021-04-28 VITALS — BP 119/63 | HR 104 | Ht 67.0 in | Wt 146.8 lb

## 2021-04-28 DIAGNOSIS — G40009 Localization-related (focal) (partial) idiopathic epilepsy and epileptic syndromes with seizures of localized onset, not intractable, without status epilepticus: Secondary | ICD-10-CM

## 2021-04-28 MED ORDER — LEVETIRACETAM 1000 MG PO TABS: 1000.0000 mg | ORAL_TABLET | Freq: Two times a day (BID) | ORAL | 5 refills | Status: DC

## 2021-04-28 NOTE — Patient Instructions (Signed)
Increase levetiracetam (keppra) to 1000mg  twice daily Continue lamotrigine 200mg  twice daily No driving for 6 months from last seizure, as per .   Please refer to the following link on the [employer]'s website for more information: http://[email or web address].org/answerplace/Social/driving/drivingu.cfm  4.   Follow up 6 months.

## 2021-06-13 ENCOUNTER — Telehealth: Payer: Self-pay | Admitting: Neurology

## 2021-06-13 DIAGNOSIS — Z79899 Other long term (current) drug therapy: Secondary | ICD-10-CM

## 2021-06-13 DIAGNOSIS — G40009 Localization-related (focal) (partial) idiopathic epilepsy and epileptic syndromes with seizures of localized onset, not intractable, without status epilepticus: Secondary | ICD-10-CM

## 2021-06-13 NOTE — Telephone Encounter (Signed)
Per Pt mother please go ahead and set up Patient Repeat MRI, EEG and Labs. Sometime in January patient should have insurance or FSA Account to pay.   EEG, MRI Brain and labs ordered with note attached.

## 2021-06-13 NOTE — Telephone Encounter (Signed)
Pt mother called, said last time her son saw jaffe, he recommended him getting blood work again, along with more scans. I saw nothing in his AVS, so she wanted me to reach out to [facility] and see. She would like a call back [phone]

## 2021-06-28 ENCOUNTER — Other Ambulatory Visit: Payer: Self-pay | Admitting: Neurology

## 2021-08-08 ENCOUNTER — Encounter: Payer: Self-pay | Admitting: Neurology

## 2021-08-08 ENCOUNTER — Other Ambulatory Visit: Payer: Self-pay

## 2021-08-08 DIAGNOSIS — Z029 Encounter for administrative examinations, unspecified: Secondary | ICD-10-CM

## 2021-11-10 NOTE — Progress Notes (Deleted)
NEUROLOGY FOLLOW UP OFFICE NOTE  Matthew Barr [id number]  Assessment/Plan:   Focal epilepsy with impaired consciousness   1  Lvetiracetam to 1000mg  twice daily and lamotrigine 200mg  twice daily 2.  When he has insurance, would repeat MRI of brain, EEG and medication levels. 3.  Follow up 6 months.   Subjective:  Matthew Barr is a 26 year old right-handed man who follows up for seizure disorder.Matthew Barr  He is accompanied by his mother who supplements history.   UPDATE:  Current medication: Lamotrigine 200 mg twice daily, Keppra 1000mg  twice daily.  Last seizure:  September 2022.  Keppra was increased to 1000mg  BID He still has an aura twice a week (feels "off").  Doesn't feel comfortable to drive.     HISTORY: On 09/04/09, he had a new onset seizure.  He was on his bed when he blacked out rolling over onto the floor.  His mother found him on the floor convulsing with foam from his mouth but no tongue biting or incontinence.  It lasted 60 to 90 seconds.  He was postictal afterward.  CT of head was unremarkable.  He did not follow up with neurology.   On 01/21/17, he was outside grilling when he suddenly dropped to the ground, hitting his head on the grill.  He exhibited extensor posturing and shaking.  It lasted about a couple of minutes.  When he woke up, he was pale and vomited.  He was confused and had a headache.  He presented to the ED for further evaluation.  CT of head was unremarkable.  CBC, BMP and EKG were normal.  He was discharged with instructions to follow up with neurology.  He never followed up with neurology.  On 06/02/17, he had a second witnessed seizure.  It was preceded by an undescribable aura for a few seconds prior to passing out.  He was postictal for about 10 to 15 minutes.  Afterwards, he was nauseous with 2 episodes of vomiting.  He returned to the ED where repeat head CT was unremarkable.  CBC and BMP were unremarkable.  He was started on a Lamictal titration later that  month.  On 07/10/17, he had reached Lamical titration of 50mg  twice daily and Keppra was discontinued.  The following day, he had another seizure.  In the ED, CT of head was normal.  He reported decreased sleep and increased caffeine intake.  He was restarted on Keppra at 500mg  twice daily.  Lamictal was further titrated to 100mg  twice daily and Keppra was again discontinued.   In April 2021, he had a seizure.  He reported that he missed his medication that day.  He had another seizure in late August.  He did not miss any medication doses, there was no change in medication manufacturer, he had been sleeping well.  He has maybe two drinks a week.  About a week later, he had a seizure aura in which lasted an hour.  Trough lamotrigine level from 03/30/2020 was 3.9.  He had not missed any dosing.  Lamotrigine was increased to 200mg  twice daily.  He had another seizure on 05/18/2020. Following this seizure, he has had another  aura last week with no loss of consciousness.   He continues to have occasional auras described as deja vu sensation with trouble focusing or feeling just "off" without loss of consciousness.   Workup: One hour sleep deprived EEG from 06/18/17 was normal. MRI of brain without contrast with seizure protocol from 06/27/17 was personally reviewed  and was normal.   He was delivered via C-section because he was breech, but otherwise uncomplicated birth.  He had no history of febrile seizures, meningitis/encephalitis, head trauma. His father may have had a questionable seizure while sick with the flu.  His maternal grandmother's uncle reportedly had epilepsy.   Sister - past month wake up and unilateral numbness 2-3 times  PAST MEDICAL HISTORY: Past Medical History:  Diagnosis Date   Epilepsy (HCC) 2018   Seizures (HCC)     MEDICATIONS: Current Outpatient Medications on File Prior to Visit  Medication Sig Dispense Refill   ibuprofen (ADVIL) 200 MG tablet Take 400 mg by mouth as  needed.     lamoTRIgine (LAMICTAL) 200 MG tablet TAKE ONE TABLET BY MOUTH TWICE A DAY 180 tablet 1   levETIRAcetam (KEPPRA) 1000 MG tablet Take 1 tablet (1,000 mg total) by mouth 2 (two) times daily. 60 tablet 5   Multiple Vitamin (MULTIVITAMIN) tablet Take 1 tablet by mouth once a week.     No current facility-administered medications on file prior to visit.    ALLERGIES: No Known Allergies  FAMILY HISTORY: No family history on file.    Objective:  *** General: No acute distress.  Patient appears ***-groomed.   Head:  Normocephalic/atraumatic Eyes:  Fundi examined but not visualized Neck: supple, no paraspinal tenderness, full range of motion Heart:  Regular rate and rhythm Lungs:  Clear to auscultation bilaterally Back: No paraspinal tenderness Neurological Exam: alert and oriented to person, place, and time.  Speech fluent and not dysarthric, language intact.  CN II-XII intact. Bulk and tone normal, muscle strength 5/5 throughout.  Sensation to light touch intact.  Deep tendon reflexes 2+ throughout, toes downgoing.  Finger to nose testing intact.  Gait normal, Romberg negative.   Matthew Millet, DO  CC: ***

## 2021-11-14 ENCOUNTER — Encounter: Payer: Self-pay | Admitting: Neurology

## 2021-11-14 ENCOUNTER — Ambulatory Visit: Payer: Self-pay | Admitting: Neurology

## 2021-11-14 DIAGNOSIS — Z029 Encounter for administrative examinations, unspecified: Secondary | ICD-10-CM

## 2021-11-23 ENCOUNTER — Telehealth: Payer: Self-pay | Admitting: Neurology

## 2021-11-23 NOTE — Telephone Encounter (Signed)
Patient dismissed from [facility] and all providers practicing at this clinic 05/03 ?

## 2022-03-21 ENCOUNTER — Other Ambulatory Visit: Payer: Self-pay | Admitting: Neurology

## 2022-08-02 DIAGNOSIS — H6691 Otitis media, unspecified, right ear: Secondary | ICD-10-CM | POA: Diagnosis not present

## 2022-08-02 DIAGNOSIS — H6122 Impacted cerumen, left ear: Secondary | ICD-10-CM | POA: Diagnosis not present

## 2022-10-10 ENCOUNTER — Encounter: Payer: Self-pay | Admitting: Physician Assistant

## 2022-10-10 ENCOUNTER — Ambulatory Visit (INDEPENDENT_AMBULATORY_CARE_PROVIDER_SITE_OTHER): Payer: Medicaid Other | Admitting: Physician Assistant

## 2022-10-10 VITALS — BP 100/70 | HR 85 | Temp 97.3°F | Ht 66.5 in | Wt 140.2 lb

## 2022-10-10 DIAGNOSIS — F329 Major depressive disorder, single episode, unspecified: Secondary | ICD-10-CM | POA: Diagnosis not present

## 2022-10-10 DIAGNOSIS — G40001 Localization-related (focal) (partial) idiopathic epilepsy and epileptic syndromes with seizures of localized onset, not intractable, with status epilepticus: Secondary | ICD-10-CM | POA: Diagnosis not present

## 2022-10-10 DIAGNOSIS — Z23 Encounter for immunization: Secondary | ICD-10-CM

## 2022-10-10 MED ORDER — LEVETIRACETAM 1000 MG PO TABS: 1000.0000 mg | ORAL_TABLET | Freq: Two times a day (BID) | ORAL | 5 refills | Status: AC

## 2022-10-10 MED ORDER — LAMOTRIGINE 200 MG PO TABS: 200.0000 mg | ORAL_TABLET | Freq: Two times a day (BID) | ORAL | 1 refills | Status: AC

## 2022-10-10 NOTE — Progress Notes (Signed)
Matthew Barr is a 27 y.o. male here for a follow up of a pre-existing problem.  History of Present Illness:   Chief Complaint  Patient presents with   Establish Care   Seizures    Pt needs to see a Neurologist, would like a referral.    Seizures     Seizures First one was in high school in 2011 and then significantly increased July 2018 He has been seeing Dr. Metta Clines -- most recent note reviewed He is currently on 1000 mg keppra twice daily and lamictal 200 mg and has been taking consistently  He has not had any insurance for a couple years so he has not seen his neurologist since Oct 2022. He is completing disability for his seizures  He is having seizures approximately monthly Having auras every other day, auras have become very frequent -- gets confused and has deja vu   Depression Has reactive depression from uncontrolled seizures Denies SI/HI or need for medication Would like to see a therapist to help cope with this process    Past Medical History:  Diagnosis Date   Epilepsy ([place]) 2018   Seizures ([place])      Social History   Tobacco Use   Smoking status: Never   Smokeless tobacco: Never  Vaping Use   Vaping Use: Never used  Substance Use Topics   Alcohol use: Not Currently    Comment: 2-3 beers a month   Drug use: No    History reviewed. No pertinent surgical history.  History reviewed. No pertinent family history.  No Known Allergies  Current Medications:   Current Outpatient Medications:    IBU 600 MG tablet, Take 600 mg by mouth every 8 (eight) hours as needed., Disp: , Rfl:    ibuprofen (ADVIL) 200 MG tablet, Take 400 mg by mouth as needed., Disp: , Rfl:    lamoTRIgine (LAMICTAL) 200 MG tablet, TAKE ONE TABLET BY MOUTH TWICE A DAY, Disp: 180 tablet, Rfl: 1   levETIRAcetam (KEPPRA) 1000 MG tablet, Take 1 tablet (1,000 mg total) by mouth 2 (two) times daily., Disp: 60 tablet, Rfl: 5   Multiple Vitamin (MULTIVITAMIN) tablet, Take 1 tablet by  mouth once a week., Disp: , Rfl:    Review of Systems:   Review of Systems  Neurological:  Positive for seizures.   Negative unless otherwise specified per HPI.   Vitals:   Vitals:   10/10/22 0901  BP: 100/70  Pulse: 85  Temp: (!) 97.3 F (36.3 C)  TempSrc: Temporal  SpO2: 97%  Weight: 140 lb 4 oz (63.6 kg)  Height: 5' 6.5" (1.689 m)     Body mass index is 22.3 kg/m.  Physical Exam:   Physical Exam Vitals and nursing note reviewed.  Constitutional:      General: He is not in acute distress.    Appearance: He is well-developed. He is not ill-appearing or toxic-appearing.  Cardiovascular:     Rate and Rhythm: Normal rate and regular rhythm.     Pulses: Normal pulses.     Heart sounds: Normal heart sounds, S1 normal and S2 normal.  Pulmonary:     Effort: Pulmonary effort is normal.     Breath sounds: Normal breath sounds.  Skin:    General: Skin is warm and dry.  Neurological:     Mental Status: He is alert.     GCS: GCS eye subscore is 4. GCS verbal subscore is 5. GCS motor subscore is 6.  Psychiatric:  Speech: Speech normal.        Behavior: Behavior normal. Behavior is cooperative.     Assessment and Plan:   Localization-related idiopathic epilepsy and epileptic syndromes with seizures of localized onset, not intractable, with status epilepticus ([place]) Uncontrolled I have refilled medications for patient so he does not run out Referral per patient request to [place]  Reactive depression Denies SI/HI Talk therapy referral placed  Need for prophylactic vaccination with combined diphtheria-tetanus-pertussis (DTP) vaccine Updated today  Follow-up in summer for CPE, sooner if concerns    Inda Coke, PA-C

## 2022-10-10 NOTE — Patient Instructions (Signed)
It was great to see you!  I'm going to refill your seizure medications today I'm going to place referral to [facility] I'm going to place referral for Therapy -- however look at handout for additional options  Follow-up in summer for a physical! Sooner if concerns of course.    Take care,  Inda Coke PA-C

## 2022-12-11 ENCOUNTER — Encounter (HOSPITAL_COMMUNITY): Payer: Self-pay

## 2022-12-11 ENCOUNTER — Ambulatory Visit (INDEPENDENT_AMBULATORY_CARE_PROVIDER_SITE_OTHER): Payer: Medicaid Other | Admitting: Clinical

## 2022-12-11 DIAGNOSIS — F330 Major depressive disorder, recurrent, mild: Secondary | ICD-10-CM

## 2022-12-11 NOTE — Progress Notes (Signed)
Comprehensive Clinical Assessment (CCA) Note  12/11/2022 Matthew Barr [id number]  Chief Complaint:  Chief Complaint  Patient presents with   Depression   Anxiety   Visit Diagnosis:   Major depressive disorder, recurrent ,mild with anxious distress  Interpretive Summary:  Client is a 27 year old male presenting to the [facility] health center for outpatient services. Client is presenting by referral of family for outpatient counseling. Client reported he has had difficulty with depression and anxiety as it relates to his health. Client reported he was diagnosed with Epilepsy 3 years ago and it has changed his life. Client reported he is unable to work and/or live alone. Client reported he can tell when he is about to have a seizure. Client reported feeling anxious and dreadful about the occurrence of having the episodes. Client reported he has a good support system with his family but needs another outlet. Client reported he has no other history of mental health that has required outpatient and/or inpatient treatment. Client reported no use of illicit substances. Client presented oriented times five, appropriately dressed, and friendly. Client denied hallucinations, delusions, suicidal and homicidal ideations. Client was screened for pain, nutrition, [place] suicide severity and the following SDOH:    12/11/2022    2:34 PM 10/10/2022    9:06 AM  GAD 7 : Generalized Anxiety Score  Nervous, Anxious, on Edge 1 2  Control/stop worrying 1 1  Worry too much - different things 1 1  Trouble relaxing 1 1  Restless 0 1  Easily annoyed or irritable 0 1  Afraid - awful might happen 1 1  Total GAD 7 Score 5 8  Anxiety Difficulty Somewhat difficult Very difficult     Flowsheet Row Counselor from 12/11/2022 in [facility]  PHQ-9 Total Score 3        Treatment recommendations: individual counseling. Client declined psychiatry at this  time.  Therapist provided information on format of appointment (virtual or face to face).   The client was advised to call back or seek an in-person evaluation if the symptoms worsen or if the condition fails to improve as anticipated before the next scheduled appointment. Client was in agreement with treatment recommendations.  CCA Biopsychosocial Intake/Chief Complaint:  client reported he is referred by hisself as he was encouraged by family. Client reported he has had epilepsy for 3 years and aftre living with it for so long it has taken its toll.  Current Symptoms/Problems: client reported feeling on edge and depressed mood  Patient Reported Schizophrenia/Schizoaffective Diagnosis in Past: No  Strengths: voluntarily seeking services  Preferences: counseling  Abilities: willingly discussing problems and needs  Type of Services Patient Feels are Needed: individual therapy  Initial Clinical Notes/Concerns: No data recorded  Mental Health Symptoms Depression:   Change in energy/activity   Duration of Depressive symptoms:  Greater than two weeks   Mania:   None   Anxiety:    Tension; Worrying   Psychosis:   None   Duration of Psychotic symptoms: No data recorded  Trauma:   None   Obsessions:   None   Compulsions:   None   Inattention:   None   Hyperactivity/Impulsivity:   None   Oppositional/Defiant Behaviors:   None   Emotional Irregularity:   None   Other Mood/Personality Symptoms:  No data recorded   Mental Status Exam Appearance and self-care  Stature:   Average   Weight:   Average weight   Clothing:  Casual   Grooming:   Normal   Cosmetic use:   Age appropriate   Posture/gait:   Normal   Motor activity:   Not Remarkable   Sensorium  Attention:   Normal   Concentration:   Normal   Orientation:   X5   Recall/memory:   Normal   Affect and Mood  Affect:   Congruent   Mood:   Euthymic   Relating  Eye contact:    None   Facial expression:   Responsive   Attitude toward examiner:   Cooperative   Thought and Language  Speech flow:  Clear and Coherent   Thought content:   Appropriate to Mood and Circumstances   Preoccupation:   None   Hallucinations:   None   Organization:  No data recorded  [employer] of Knowledge:   Good   Intelligence:   Average   Abstraction:   Normal   Judgement:   Good   Reality Testing:   Adequate   Insight:   Good   Decision Making:   Normal   Social Functioning  Social Maturity:   Responsible   Social Judgement:   Normal   Stress  Stressors:   Transitions; Illness   Coping Ability:   Resilient   Skill Deficits:   Communication; Self-care   Supports:   Family; Friends/Service system     Religion: Religion/Spirituality Are You A Religious Person?: No  Leisure/Recreation: Leisure / Recreation Do You Have Hobbies?: No  Exercise/Diet: Exercise/Diet Do You Exercise?: No Have You Gained or Lost A Significant Amount of Weight in the Past Six Months?: No Do You Follow a Special Diet?: No Do You Have Any Trouble Sleeping?: No   CCA Employment/Education Employment/Work Situation: Employment / Work Situation Employment Situation: Unemployed Patient's Job has Been Impacted by Current Illness: No  Education: Education Did Garment/textile technologist From [employer]?: Yes Did Theme park manager?: Yes What Type of College Degree Do you Have?: client reported some college experience.   CCA Family/Childhood History Family and Relationship History: Family history Marital status: Single Does patient have children?: No  Childhood History:  Childhood History By whom was/is the patient raised?: Both parents Additional childhood history information: client reported he had a good childhood. Patient's description of current relationship with people who raised him/her: client reported he has a good relationship with his  parents. Does patient have siblings?: Yes Number of Siblings: 3 Description of patient's current relationship with siblings: Client repored he has 2 brothers and a sister, good relationship with them all. Did patient suffer any verbal/emotional/physical/sexual abuse as a child?: No Did patient suffer from severe childhood neglect?: No Has patient ever been sexually abused/assaulted/raped as an adolescent or adult?: No Was the patient ever a victim of a crime or a disaster?: No Witnessed domestic violence?: No Has patient been affected by domestic violence as an adult?: No  Child/Adolescent Assessment:     CCA Substance Use Alcohol/Drug Use: Alcohol / Drug Use History of alcohol / drug use?: No history of alcohol / drug abuse                         ASAM's:  Six Dimensions of Multidimensional Assessment  Dimension 1:  Acute Intoxication and/or Withdrawal Potential:      Dimension 2:  Biomedical Conditions and Complications:      Dimension 3:  Emotional, Behavioral, or Cognitive Conditions and Complications:     Dimension 4:  Readiness to Change:     Dimension 5:  Relapse, Continued use, or Continued Problem Potential:     Dimension 6:  Recovery/Living Environment:     ASAM Severity Score:    ASAM Recommended Level of Treatment:     Substance use Disorder (SUD)    Recommendations for Services/Supports/Treatments: Recommendations for Services/Supports/Treatments Recommendations For Services/Supports/Treatments: Individual Therapy  DSM5 Diagnoses: Patient Active Problem List   Diagnosis Date Noted   Localization-related idiopathic epilepsy and epileptic syndromes with seizures of localized onset, not intractable, with status epilepticus (HCC) 10/27/2020   Perineum pain, male 12/31/2018    Patient Centered Plan: Patient is on the following Treatment Plan(s):  Depression   Referrals to Alternative Service(s): Referred to Alternative Service(s):   Place:    Date:   Time:    Referred to Alternative Service(s):   Place:   Date:   Time:    Referred to Alternative Service(s):   Place:   Date:   Time:    Referred to Alternative Service(s):   Place:   Date:   Time:      Collaboration of Care: Referral or follow-up with counselor/therapist AEB [facility]  Patient/Guardian was advised Release of Information must be obtained prior to any record release in order to collaborate their care with an outside provider. Patient/Guardian was advised if they have not already done so to contact the registration department to sign all necessary forms in order for [place] to release information regarding their care.   Consent: Patient/Guardian gives verbal consent for treatment and assignment of benefits for services provided during this visit. Patient/Guardian expressed understanding and agreed to proceed.   Neena Rhymes Johndaniel Catlin, LCSW

## 2023-01-18 ENCOUNTER — Ambulatory Visit (INDEPENDENT_AMBULATORY_CARE_PROVIDER_SITE_OTHER): Payer: Medicaid Other | Admitting: Clinical

## 2023-01-18 DIAGNOSIS — F330 Major depressive disorder, recurrent, mild: Secondary | ICD-10-CM | POA: Diagnosis not present

## 2023-01-20 NOTE — Progress Notes (Signed)
THERAPIST PROGRESS NOTE Virtual Visit via Video Note  I connected with Matthew Barr on 01/20/23 at 11:00 AM EDT by a video enabled telemedicine application and verified that I am speaking with the correct person using two identifiers.  Location: Patient: home Provider: office   I discussed the limitations of evaluation and management by telemedicine and the availability of in person appointments. The patient expressed understanding and agreed to proceed.   Follow Up Instructions: I discussed the assessment and treatment plan with the patient. The patient was provided an opportunity to ask questions and all were answered. The patient agreed with the plan and demonstrated an understanding of the instructions.   The patient was advised to call back or seek an in-person evaluation if the symptoms worsen or if the condition fails to improve as anticipated.   Session Time: 45 minutes  Participation Level: Active  Behavioral Response: CasualAlertEuthymic  Type of Therapy: Individual Therapy  Treatment Goals addressed: client will practice behavioral activation skills 3 times per week for the next 12 weeks  ProgressTowards Goals: Progressing  Interventions: CBT and Supportive  Summary:  Matthew Barr is a 27 y.o. male who presents for the scheduled appointment oriented times five, appropriately dressed and friendly. Client denied hallucinations and delusions. Client reported on today he is doing pretty well. Client reported it has been a little over a month since he had a seizure. Client reported it is relieving to have a break. 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 Client reported he does not go out much without his sister or mom because of his health. Client reported his mood can be up and down. Client reported he does feel bummed by the limitations of his life. Client reported his seizures and sporadic nature of them are  the reason he cannot sustain work. Client reported strenuous activity has limitations because he can become unwell. Client reported he still has "aura's" that occur before having a seizure. Client reported he usually vents to his sister and mother but he does not want to burden them. Client reported this year seems to be more positive with being a little more social. 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  Evidence of progress towards goal:  client reported behavioral activation 7 days per week.  Suicidal/Homicidal: Nowithout intent/plan  Therapist Response:  Therapist began the appointment asking the client how he has been doing. Therapist used cbt to engage using active listening and positive emotional support. Therapist used cbt to engage and ask him to describe the severity of depressive symptoms and daily functioning. Therapist used cbt to reinforce using his good days to stay active in acceptable ways. Therapist used CBT ask the client to identify his progress with frequency of use with coping skills with continued practice in his daily activity.    Therapist assigned the client homework to practice self care.   Plan: Return again in 4 weeks.  Diagnosis: major depressive disorder, recurrent episode, mild with anxious distress  Collaboration of Care: Patient refused AEB none requested by the client.  Patient/Guardian was advised Release of Information must be obtained prior to any record release in order to collaborate their care with an outside provider. Patient/Guardian was advised if they have not already done so to contact the registration department to sign all necessary forms in order for [place] to release information regarding their care.   Consent: Patient/Guardian gives  verbal consent for treatment and assignment of benefits for services provided during this visit. Patient/Guardian expressed understanding and agreed to proceed.   Neena Rhymes Kate Larock,  LCSW 01/18/2023

## 2023-02-02 ENCOUNTER — Ambulatory Visit (INDEPENDENT_AMBULATORY_CARE_PROVIDER_SITE_OTHER): Payer: Medicaid Other | Admitting: Clinical

## 2023-02-02 DIAGNOSIS — F330 Major depressive disorder, recurrent, mild: Secondary | ICD-10-CM

## 2023-02-02 NOTE — Progress Notes (Signed)
   THERAPIST PROGRESS NOTE  Session Time: 45 minutes  Participation Level: Active  Behavioral Response: CasualAlertDepressed and Euthymic  Type of Therapy: Individual Therapy  Treatment Goals addressed: client will practice behavioral activation skills 3 times per week for the next 12 weeks  ProgressTowards Goals: Progressing  Interventions: CBT and Supportive  Summary:  Matthew Barr is a 27 y.o. male who presents for the scheduled appointment oriented times five, appropriately dressed and friendly. Client denied hallucinations and delusions. Client reported on today the past week has been tough. Client reported he was having mild symptoms precedent to a seizure. Client reported the sensation can last from a few seconds to half a hour or more. Client reported although having seizures is harder it is harder for him to deal with his family having to see him in that way. Client reported he feels bad about that. Client reported otherwise he finds himself "reliving" moments from his past going back at least 10 years. Client reported it feels more than reminiscing but life like. Client reported his siblings strongly encourage him to date and be more social. Client reported meeting people is not hard but maintaining relationships. Client reported his health makes him scared of someone seeing him have a seizure. Evidence of progress towards goal:  client reported 1 negative thought pattern about himself and health that contributes to depression and lack of interaction.  Suicidal/Homicidal: Nowithout intent/plan  Therapist Response:  Therapist began the appointment asking the client how he has been doing since last seen. Therapist used CBT to engage using active listening and positive emotional support. Therapist used CBT to engage and ask the client about severity of depression and how it impacts his thoughts and daily life. Therapist used CBT to normalize his emotions and reinforce engagement  with activity and others as appropriate. Therapist used CBT ask the client to identify his progress with frequency of use with coping skills with continued practice in his daily activity.    Therapist assigned the client homework to practice self care.   Plan: Return again in 4 weeks.  Diagnosis: major depressive disorder, recurrent episode, mild with anxious distress  Collaboration of Care: Patient refused AEB none requested by the client.  Patient/Guardian was advised Release of Information must be obtained prior to any record release in order to collaborate their care with an outside provider. Patient/Guardian was advised if they have not already done so to contact the registration department to sign all necessary forms in order for [place] to release information regarding their care.   Consent: Patient/Guardian gives verbal consent for treatment and assignment of benefits for services provided during this visit. Patient/Guardian expressed understanding and agreed to proceed.   Neena Rhymes Alexande Sheerin, LCSW 02/02/2023

## 2023-02-06 ENCOUNTER — Encounter: Payer: Self-pay | Admitting: Internal Medicine

## 2023-02-06 ENCOUNTER — Ambulatory Visit: Payer: Medicaid Other | Admitting: Internal Medicine

## 2023-02-06 VITALS — BP 109/77 | HR 97 | Temp 98.0°F | Ht 66.5 in | Wt 145.6 lb

## 2023-02-06 DIAGNOSIS — R1909 Other intra-abdominal and pelvic swelling, mass and lump: Secondary | ICD-10-CM | POA: Diagnosis not present

## 2023-02-06 DIAGNOSIS — K625 Hemorrhage of anus and rectum: Secondary | ICD-10-CM | POA: Diagnosis not present

## 2023-02-06 NOTE — Progress Notes (Signed)
[facility]: [phone]   Routine Medical Office Visit  Patient:  Matthew Barr      Age: 27 y.o.       Sex:  male  Date:   02/06/2023 Patient Care Team: Jarold Motto, [place] as PCP - General (Physician Assistant) Drema Dallas, DO as Consulting Physician (Neurology) Today's Healthcare Provider: Lula Olszewski, MD   Assessment and Plan:    Dwan was seen today for groin pain, possible hemorrhoid and possible hernia.    Inguinal bulge -     US PELVIS LIMITED (TRANSABDOMINAL ONLY); Future Groin Pain/Possible Hernia: He reports a bulging sensation in his groin area, especially when lifting, worsening over the past two months, yet no palpable bulge or severe pain or signs of strangulation are noted. We will order an ultrasound to confirm the diagnosis and refer him to a surgeon for possible surgical intervention if confirmed.   BRBPR (bright red blood per rectum) -     Ambulatory referral to Gastroenterology Request possible procedural hemorrhoid correction if found/confirmed during colonoscopy. Suspected Hemorrhoids: He has experienced symptoms consistent with hemorrhoids for the past three years without severe pain or bleeding. We will refer him to a GI specialist for anoscopy and possible banding or injection treatment during colonoscopy and advise him to monitor for any changes in symptoms, reporting any severe pain or bleeding.  General Health Maintenance: With a history of epilepsy and currently on Medicaid, we advise him to sign a release at the front desk for records from [facility] to be faxed to [place] for continuity of care. We also advise him to perform regular testicular self-exams due to his age and current groin symptoms.     Future Appointments  Date Time Provider Department Center  03/02/2023 11:00 AM Cozart, Neena Rhymes, LCSW [facility] None  03/22/2023  2:00 PM Cozart, Neena Rhymes, LCSW [facility] None           Clinical Presentation:    27 y.o. male who has  Perineum pain, male; Localization-related idiopathic epilepsy and epileptic syndromes with seizures of localized onset, not intractable, with status epilepticus (HCC); BRBPR (bright red blood per rectum); and Inguinal bulge on their problem list. His reasons/main concerns/chief complaints for today's office visit are Groin Pain (Intermittent for about three years.), Possible hemorrhoid (Constant for more than three years.), and Possible hernia (Intermittent for less than three years.)   AI-Extracted: Discussed the use of AI scribe software for clinical note transcription with the patient, who gave verbal consent to proceed.  History of Present Illness   The patient, a known epileptic, presented with a primary complaint of persistent groin discomfort and rectal issues, suspected to be a hernia and hemorrhoids respectively. These symptoms have been present for an estimated three years, possibly longer.  The suspected hernia was first noticed during lower body workouts, where the patient experienced a distinct sensation in the groin area. Although the discomfort was not severe, the patient recognized that something was amiss. Due to lack of medical coverage at the time, the patient did not pursue further investigation and hoped the issue was temporary. However, the discomfort persisted intermittently over the years. In the past two months, the patient noted an increase in the severity of the discomfort, describing it as "more wrong than prior". The patient reported a bulging sensation in the groin area, particularly noticeable during lifting activities. The patient was unable to palpate any distinct bulge but felt an internal sensation of something being out of place.  The patient also reported rectal issues, suspected to be hemorrhoids, which have been present for the same duration as the groin discomfort. The patient described the discomfort as internal, slightly up the rectum. The patient also reported two  instances of bright red blood in the stool within the past year, along with occasional rectal irritation.  The patient expressed concern about the potential for these symptoms to indicate a more serious condition, despite being fairly certain of the suspected diagnoses. The patient expressed a desire for diagnostic confirmation to alleviate these concerns. The patient also mentioned a history of epilepsy, which is currently managed with Medicaid.        Reviewed chart data: Past Medical History:  Diagnosis Date   Epilepsy (HCC) 2018   Seizures (HCC)     Outpatient Medications Prior to Visit  Medication Sig   IBU 600 MG tablet Take 600 mg by mouth every 8 (eight) hours as needed.   ibuprofen (ADVIL) 200 MG tablet Take 400 mg by mouth as needed.   lamoTRIgine (LAMICTAL) 200 MG tablet Take 1 tablet (200 mg total) by mouth 2 (two) times daily.   levETIRAcetam (KEPPRA) 1000 MG tablet Take 1 tablet (1,000 mg total) by mouth 2 (two) times daily.   Multiple Vitamin (MULTIVITAMIN) tablet Take 1 tablet by mouth once a week.   No facility-administered medications prior to visit.         Clinical Data Analysis:   Physical Exam  BP 109/77 (BP Location: Left Arm, Patient Position: Sitting)   Pulse 97   Temp 98 F (36.7 C) (Temporal)   Ht 5' 6.5" (1.689 m)   Wt 145 lb 9.6 oz (66 kg)   SpO2 98%   BMI 23.15 kg/m  Wt Readings from Last 10 Encounters:  02/06/23 145 lb 9.6 oz (66 kg)  10/10/22 140 lb 4 oz (63.6 kg)  04/28/21 146 lb 12.8 oz (66.6 kg)  10/27/20 146 lb 6.4 oz (66.4 kg)  06/03/20 153 lb (69.4 kg)  09/22/19 149 lb (67.6 kg)  12/31/18 142 lb (64.4 kg)  09/27/18 146 lb (66.2 kg)  03/01/18 149 lb (67.6 kg)  07/10/17 145 lb 6 oz (65.9 kg)   Vital signs reviewed.  Nursing notes reviewed. Weight trend reviewed. Abnormalities and Problem-Specific physical exam findings:  declined rectal and groin exams.  General Appearance:  No acute distress appreciable.   Well-groomed,  healthy-appearing male.  Well proportioned with no abnormal fat distribution.  Good muscle tone. Skin: Clear and well-hydrated. Pulmonary:  Normal work of breathing at rest, no respiratory distress apparent. SpO2: 98 %  Musculoskeletal: All extremities are intact.  Neurological:  Awake, alert, oriented, and engaged.  No obvious focal neurological deficits or cognitive impairments.  Sensorium seems unclouded.   Speech is clear and coherent with logical content. Psychiatric:  Appropriate mood, pleasant and cooperative demeanor, thoughtful and engaged during the exam  Results Reviewed:      No results found for any visits on 02/06/23.  No visits with results within 1 Year(s) from this visit.  Latest known visit with results is:  Lab on 03/30/2020  Component Date Value   Lamotrigine Lvl 03/30/2020 3.9 (L)    No image results found.   No results found.     This encounter employed real-time, collaborative documentation. The patient actively reviewed and updated their medical record on a shared screen, ensuring transparency and facilitating joint problem-solving for the problem list, overview, and plan. This approach promotes accurate, informed care. The treatment plan  was discussed and reviewed in detail, including medication safety, potential side effects, and all patient questions. We confirmed understanding and comfort with the plan. Follow-up instructions were established, including contacting the office for any concerns, returning if symptoms worsen, persist, or new symptoms develop, and precautions for potential emergency department visits. ----------------------------------------------------- Lula Olszewski, MD  02/06/2023 10:49 AM  [facility]:  [phone]

## 2023-02-06 NOTE — Patient Instructions (Signed)
VISIT SUMMARY:  During your visit, we discussed your ongoing groin discomfort and rectal issues, which you suspect to be a hernia and hemorrhoids respectively. We also touched on your history of epilepsy, which is currently managed with Medicaid.  YOUR PLAN:  -GROIN PAIN/POSSIBLE HERNIA: You've been experiencing a bulging sensation in your groin area, especially when lifting. This could be a hernia, which is a condition where an organ pushes through an opening in the muscle or tissue that holds it in place. We will order an ultrasound to confirm this and if confirmed, we will refer you to a surgeon for possible surgical intervention.  -HEMORRHOIDS: You've been experiencing symptoms consistent with hemorrhoids for the past three years. Hemorrhoids are swollen veins in the lowest part of your rectum and anus. We will refer you to a GI specialist for further examination and possible treatment. Please monitor for any changes in symptoms, reporting any severe pain or bleeding.  -GENERAL HEALTH MAINTENANCE: Given your history of epilepsy, we advise you to sign a release at the front desk for records from [facility] to be faxed to [place] for continuity of care. Also, due to your age and current groin symptoms, we recommend that you perform regular testicular self-exams.  INSTRUCTIONS:  Please sign a release at the front desk for your records from [facility] to be faxed to [place]. Also, remember to perform regular testicular self-exams. Monitor your symptoms and report any severe pain or bleeding. We will be ordering an ultrasound for your groin discomfort and referring you to a GI specialist for your rectal issues.

## 2023-02-12 ENCOUNTER — Ambulatory Visit
Admission: RE | Admit: 2023-02-12 | Discharge: 2023-02-12 | Disposition: A | Discharge status: Home / Self Care | Payer: Medicaid Other | Source: Ambulatory Visit | Attending: Internal Medicine | Admitting: Internal Medicine

## 2023-02-12 DIAGNOSIS — R109 Unspecified abdominal pain: Secondary | ICD-10-CM | POA: Diagnosis not present

## 2023-02-12 DIAGNOSIS — R1909 Other intra-abdominal and pelvic swelling, mass and lump: Secondary | ICD-10-CM

## 2023-02-20 NOTE — Progress Notes (Signed)
Please follow up with the patient by phone if they haven't viewed their MyChart results within 5 days. Inform them that their pelvic ultrasound results from 02/12/23 are available. Summarize the key findings: 1) No definitive hernia identified 2) No suspicious masses or abnormalities seen in the groin area  Emphasize that while these results are reassuring, they don't completely rule out a hernia if symptoms persist. Recommend scheduling a follow-up appointment for further evaluation. Remind the patient to avoid activities that exacerbate their discomfort. Ask if they have any questions or concerns about the results or follow-up plan.

## 2023-03-02 ENCOUNTER — Ambulatory Visit (HOSPITAL_COMMUNITY): Payer: Medicaid Other | Admitting: Clinical

## 2023-03-22 ENCOUNTER — Ambulatory Visit (HOSPITAL_COMMUNITY): Payer: Medicaid Other | Admitting: Clinical

## 2023-03-22 DIAGNOSIS — F330 Major depressive disorder, recurrent, mild: Secondary | ICD-10-CM | POA: Diagnosis not present

## 2023-03-22 NOTE — Progress Notes (Signed)
   THERAPIST PROGRESS NOTE Virtual Visit via Video Note  I connected with Matthew Barr on 03/22/2023 at  2:00 PM EDT by a video enabled telemedicine application and verified that I am speaking with the correct person using two identifiers.  Location: Patient: home Provider: office   I discussed the limitations of evaluation and management by telemedicine and the availability of in person appointments. The patient expressed understanding and agreed to proceed.  Follow Up Instructions: I discussed the assessment and treatment plan with the patient. The patient was provided an opportunity to ask questions and all were answered. The patient agreed with the plan and demonstrated an understanding of the instructions.   The patient was advised to call back or seek an in-person evaluation if the symptoms worsen or if the condition fails to improve as anticipated.   Session Time: 30 minutes  Participation Level: Active  Behavioral Response: CasualAlertEuthymic  Type of Therapy: Individual Therapy  Treatment Goals addressed: client will practice behavioral activation skills 3 times per week for the next 12 weeks  ProgressTowards Goals: Progressing  Interventions: CBT and Supportive  Summary:  Matthew Barr is a 27 y.o. male who presents for the scheduled appointment oriented x 5, appropriate dressed, and friendly.  Client denied hallucinations or delusions. Client reported on today he is doing well.  Client reported the last 2 weeks have gone very well for him.  Client reported he rescheduled his last therapy session because he was completing what has been previously been suggested to him and his therapy sessions which is socializing.  Client reported he went to meet up with her friend who was friends with some more people and they are coming out to watch TV and play games and eat.  Client reported it was a very nice time as it has been years since he genuinely had quality time with friends.  Client reported he is hyper aware of daydreaming. Client reported it is mainly reliving old events that hurt his feelings. Client reported he has moved on but not sure why he sits with it. Evidence of progress towards goal:  client reported he exposed himself to engaging in 1 social situation.    Suicidal/Homicidal: Nowithout intent/plan  Therapist Response:  Therapist began the appointment asking the client how he has been doing. Therapist used CBT to engage using active listening and positive emotional support. Therapist used CBT to engage and ask the client how severity of his anxiety has been and how he has managed that. Therapist used cbt to engage discuss anxiety coping skills and positively reinforce his progressive activity. Therapist used CBT ask the client to identify her progress with frequency of use with coping skills with continued practice in her daily activity.    Therapist assigned homework to practice self care.   Plan: Return again in 4 weeks.  Diagnosis: major depressive disorder, recurrent episode, mild with anxious  Collaboration of Care: Patient refused AEB none requested by the client.  Patient/Guardian was advised Release of Information must be obtained prior to any record release in order to collaborate their care with an outside provider. Patient/Guardian was advised if they have not already done so to contact the registration department to sign all necessary forms in order for [place] to release information regarding their care.   Consent: Patient/Guardian gives verbal consent for treatment and assignment of benefits for services provided during this visit. Patient/Guardian expressed understanding and agreed to proceed.   Neena Rhymes Damyiah Moxley, LCSW 03/22/2023

## 2023-04-02 DIAGNOSIS — G40009 Localization-related (focal) (partial) idiopathic epilepsy and epileptic syndromes with seizures of localized onset, not intractable, without status epilepticus: Secondary | ICD-10-CM | POA: Diagnosis not present

## 2023-04-05 ENCOUNTER — Ambulatory Visit (INDEPENDENT_AMBULATORY_CARE_PROVIDER_SITE_OTHER): Payer: Medicaid Other | Admitting: Clinical

## 2023-04-05 ENCOUNTER — Encounter (HOSPITAL_COMMUNITY): Payer: Self-pay

## 2023-04-05 DIAGNOSIS — F330 Major depressive disorder, recurrent, mild: Secondary | ICD-10-CM

## 2023-04-05 NOTE — Progress Notes (Signed)
THERAPIST PROGRESS NOTE Virtual Visit via Video Note  I connected with Matthew Barr on 04/05/23 at  2:00 PM EDT by a video enabled telemedicine application and verified that I am speaking with the correct person using two identifiers.  Location: Patient: Home Provider: Office   I discussed the limitations of evaluation and management by telemedicine and the availability of in person appointments. The patient expressed understanding and agreed to proceed.   Follow Up Instructions: I discussed the assessment and treatment plan with the patient. The patient was provided an opportunity to ask questions and all were answered. The patient agreed with the plan and demonstrated an understanding of the instructions.   The patient was advised to call back or seek an in-person evaluation if the symptoms worsen or if the condition fails to improve as anticipated.   Session Time: 40 minutes  Participation Level: Active  Behavioral Response: CasualAlertEuthymic  Type of Therapy: Individual Therapy  Treatment Goals addressed: Client will practice behavioral activation skills 3 times per week for the next 4 weeks  ProgressTowards Goals: Progressing  Interventions: CBT and Supportive  Summary:  Matthew Barr is a 27 y.o. male who presents for the scheduled appointment oriented x 5, appropriately dressed, and friendly.  Client denied hallucinations and delusions. Client reported on today he has had a good past 2 weeks.  Client reported he is currently 4 months without a seizure.  Client reported since he was last seen he has gone to see a neurologist to get a second opinion via [facility].  Client reported he had a good appointment and they discussed some options that they want him to finalize in 3 months when he sees them again.  Client reported he will need to decide between trying any seizure medicine on his own at home while going under observation at the hospital and medicated to see if they  can capture him having a seizure to determine what part of his brain it is coming from.  Client reported that his pain in the back of his mind heavily with trying to figure out which option he would like to go with.  Client reported otherwise he has noticed that since engaging in outpatient therapy he has been more active with doing projects and being more social.  Client reported he finds great satisfaction and that instead of feeling confined to his room. Evidence of progress towards goal: Client reported since he was last seen he has gone out to enjoy 1 social outing which has helped to reduce depressive symptoms.   Suicidal/Homicidal: Nowithout intent/plan  Therapist Response:  Therapist began the appointment asking the client how he has been doing since last seen. Therapist used CBT to engage using active listening and positive emotional support. Therapist used CBT to engage the client and ask him about the current severity of any anxiety and/or depressive symptoms if any are present. Therapist used CBT to positively reinforce the clients being proactive in taking initiated to stay positively engaged weekly and in taking care of himself. Therapist used CBT to continue teaching the client about being positively mindful. Therapist used CBT ask the client to identify his progress with frequency of use with coping skills with continued practice in his daily activity.    Client reported for his homework he wants to continue working on getting out of the house to do things within his physical ability.   Plan: Return again in 2 weeks.  Diagnosis: Major depressive disorder, recurrent episode, mild with  anxious distress  Collaboration of Care: Patient refused AEB none requested by the client.  Patient/Guardian was advised Release of Information must be obtained prior to any record release in order to collaborate their care with an outside provider. Patient/Guardian was advised if they have not  already done so to contact the registration department to sign all necessary forms in order for [place] to release information regarding their care.   Consent: Patient/Guardian gives verbal consent for treatment and assignment of benefits for services provided during this visit. Patient/Guardian expressed understanding and agreed to proceed.   Neena Rhymes Mikhai Bienvenue, LCSW 04/05/2023

## 2023-04-19 ENCOUNTER — Ambulatory Visit (INDEPENDENT_AMBULATORY_CARE_PROVIDER_SITE_OTHER): Payer: Medicaid Other | Admitting: Clinical

## 2023-04-19 DIAGNOSIS — F330 Major depressive disorder, recurrent, mild: Secondary | ICD-10-CM

## 2023-04-19 NOTE — Progress Notes (Signed)
THERAPIST PROGRESS NOTE Virtual Visit via Video Note  I connected with Matthew Barr on 04/19/2023 at 11:00 AM EDT by a video enabled telemedicine application and verified that I am speaking with the correct person using two identifiers.  Location: Patient: home Provider: office   I discussed the limitations of evaluation and management by telemedicine and the availability of in person appointments. The patient expressed understanding and agreed to proceed.   Follow Up Instructions: I discussed the assessment and treatment plan with the patient. The patient was provided an opportunity to ask questions and all were answered. The patient agreed with the plan and demonstrated an understanding of the instructions.   The patient was advised to call back or seek an in-person evaluation if the symptoms worsen or if the condition fails to improve as anticipated.   Session Time: 40 minutes  Participation Level: Active  Behavioral Response: CasualAlertEuthymic  Type of Therapy: Individual Therapy  Treatment Goals addressed: : Shashank will practice behavioral activation skills 3 times per week for the next 12 weeks   ProgressTowards Goals: Progressing  Interventions: CBT and Supportive  Summary:  Matthew Barr is a 27 y.o. male who presents for the scheduled appointment oriented times five, appropriately dressed and friendly. Client denied hallucinations and delusions. Client reported he has been doing fairly well over the past 2 weeks. Client reported last week he had preceptor symptoms of a seizure multiple times. Client reported when that happens it affects his ability to get quality sleep up to a week after. Client reported that does cause some worry because he had not had a seizure since June 2024. Client reported he tend to think  about the future and how he will be able to care for himself as he gets older. Client reported he knows he will not able to depend on his parents forever.  Client reported he has continued to think of ways to be more active within reason. Client reported being confined to his room is not good and he is not able to drive. Client reported he will maybe spend tie reading outside and doing mild exercises. Evidence of progress towards goal:  client reported behavioral activation at least 2 days per week.   Suicidal/Homicidal: Nowithout intent/plan  Therapist Response:  Therapist began the appointment asking the client how he has been doing. Therapist used CBT to engage using active listening and positive emotional support. Therapist used CBT to engage and ask the client how he has been doing since last seen. Therapist used CBT to give him time to express his thoughts about health and how it is changing his way of thinking towards the future. Therapist used CBT to normalize his emotions and reinforce his ideas to improve his quality of life. Therapist used CBT ask the client to identify his progress with frequency of use with coping skills with continued practice in his daily activity.    Therapist assigned the client homework to do the discussed activities.   Plan: Return again in 4 weeks.  Diagnosis: MDD, recurrent episode mild with anxious distress  Collaboration of Care: Patient refused AEB none requested by the client.  Patient/Guardian was advised Release of Information must be obtained prior to any record release in order to collaborate their care with an outside provider. Patient/Guardian was advised if they have not already done so to contact the registration department to sign all necessary forms in order for [place] to release information regarding their care.   Consent: Patient/Guardian gives  verbal consent for treatment and assignment of benefits for services provided during this visit. Patient/Guardian expressed understanding and agreed to proceed.   Neena Rhymes Naiomi Musto, LCSW 04/19/2023

## 2023-05-10 ENCOUNTER — Ambulatory Visit (HOSPITAL_COMMUNITY): Payer: Medicaid Other | Admitting: Clinical

## 2023-05-10 DIAGNOSIS — F330 Major depressive disorder, recurrent, mild: Secondary | ICD-10-CM

## 2023-05-10 DIAGNOSIS — F321 Major depressive disorder, single episode, moderate: Secondary | ICD-10-CM

## 2023-05-10 NOTE — Progress Notes (Signed)
THERAPIST PROGRESS NOTE Virtual Visit via Video Note  I connected with Matthew Barr on 05/10/23 at  3:00 PM EDT by a video enabled telemedicine application and verified that I am speaking with the correct person using two identifiers.  Location: Patient: Home Provider: Office   I discussed the limitations of evaluation and management by telemedicine and the availability of in person appointments. The patient expressed understanding and agreed to proceed.   Follow Up Instructions: I discussed the assessment and treatment plan with the patient. The patient was provided an opportunity to ask questions and all were answered. The patient agreed with the plan and demonstrated an understanding of the instructions.   The patient was advised to call back or seek an in-person evaluation if the symptoms worsen or if the condition fails to improve as anticipated.   Session Time: 45 minutes  Participation Level: Active  Behavioral Response: CasualAlertEuthymic  Type of Therapy: Individual Therapy  Treatment Goals addressed: Harlie will practice behavioral activation skills 3 times per week for the next 12 weeks   ProgressTowards Goals: Progressing  Interventions: CBT and Supportive  Summary:  ISSACC Barr is a 27 y.o. male who presents with a scheduled appointment with x 5, appropriately dressed, and friendly.  Client denied hallucinations and delusions. Client reportedhe has been managing fairly okay but the past 2 weeks have had some changes.  Client reported at least every other day he has had preceptor symptoms of having a seizure.  Client reported that does tend to be particularly stressful.  Client reported he has noticed when that happens he tends to have a dj vu type of moments where he recalls different memories from the past and they seem presently active.  Client reported that sensation last for approximately 6 or 8 minutes.  Client reported he does have an upcoming procedure  in November where he will be observed in the hospital and then sent home with a monitoring machine to see if further determination can be made about the origin of his seizures and to change his medications.  Client reported he knows that should be done but is not looking forward to being exposed to having multiple seizures into the time.  Client reported he has talked with a disability lawyer to see about his options.  Client reported he would like to work but due to the spontaneity of his seizures he knows it would be a liability if he.  Client reported often times he is up for few hours and then her sitting down due to how he is feeling.  Client reported he does his best to stay optimistic and find things that are 24 throughout his day and week but his diagnosis has gotten the best of his mood for the time. Evidence of progress towards goal: Client reported he has 3 goals that he will discuss at his next therapy session he would like to work on.  Suicidal/Homicidal: Nowithout intent/plan  Therapist Response:  Therapist began the appointment asking client how he has been doing since last seen. Therapist used CBT to engage using active listening and positive emotional support. Therapist used CBT to ask the client open-ended questions about his mood has been over the past few weeks and contributing factors that have affected how he feels. Therapist used CBT to normalize the clients emotional response compared to his ongoing feelings with managing his health and finding pleasantries in his daily life. Therapist used CBT ask the client to identify his progress with  frequency of use with coping skills with continued practice in his daily activity.    Therapist is improving to practice self-care as well as to write down his doing goals that he has brainstorming  Plan: Return again in 4 weeks.  Diagnosis: Major depressive disorder, in episode, moderate with anxious distress  Collaboration of Care: Patient  refused AEB none requested by the client.  Patient/Guardian was advised Release of Information must be obtained prior to any record release in order to collaborate their care with an outside provider. Patient/Guardian was advised if they have not already done so to contact the registration department to sign all necessary forms in order for [place] to release information regarding their care.   Consent: Patient/Guardian gives verbal consent for treatment and assignment of benefits for services provided during this visit. Patient/Guardian expressed understanding and agreed to proceed.   Neena Rhymes Sam Wunschel, LCSW 05/10/2023

## 2023-06-06 ENCOUNTER — Ambulatory Visit (INDEPENDENT_AMBULATORY_CARE_PROVIDER_SITE_OTHER): Payer: Medicaid Other | Admitting: Clinical

## 2023-06-06 DIAGNOSIS — F330 Major depressive disorder, recurrent, mild: Secondary | ICD-10-CM

## 2023-06-06 NOTE — Progress Notes (Signed)
THERAPIST PROGRESS NOTE Virtual Visit via Video Note  I connected with Matthew Barr on 06/06/2023 at  4:00 PM EST by a video enabled telemedicine application and verified that I am speaking with the correct person using two identifiers.  Location: Patient: home Provider: office   I discussed the limitations of evaluation and management by telemedicine and the availability of in person appointments. The patient expressed understanding and agreed to proceed.   Follow Up Instructions: I discussed the assessment and treatment plan with the patient. The patient was provided an opportunity to ask questions and all were answered. The patient agreed with the plan and demonstrated an understanding of the instructions.   The patient was advised to call back or seek an in-person evaluation if the symptoms worsen or if the condition fails to improve as anticipated.    Session Time: 45 minutes  Participation Level: Active  Behavioral Response: CasualAlertDepressed  Type of Therapy: Individual Therapy  Treatment Goals addressed: Matthew Barr will practice behavioral activation skills 3 times per week for the next 12 weeks   ProgressTowards Goals: Progressing  Interventions: CBT and Supportive  Summary:  Matthew Barr is a 27 y.o. male who presents for the scheduled appointment oriented x 5, appropriately dressed, and friendly.  Client denied hallucinations and delusions. Client reported on today he is feeling depressed.  Client reported his dog that he has had for quite some time was put down today.  Client reported stayed home that time was coming by his parents told him 2 days ago that it would need to be done.  Client reported is been very emotional day for him.  Client reported also his new appointment with the epilepsy specialist through [facility] canceled his appointment that he has been waiting for months to have.  Client reported feeling disappointed but the appointment was rescheduled  for next year.  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  Client reported he tested his limits to see if she would be possible for him to work.  Client reported because of how strenuous the task was he had preceptor symptoms of a seizure and then had a seizure.  Client reported it does provide confirmation for him that if he were to try to work that he would be a liability.  Client reported it was also depressing and seeing that he would not be able to sustain a job.  Client reported he has been trying to also focus on some positive aspects in his life.  Client reported some family and friends that together to do a Halloween party which was fun. Evidence of progress towards goal: Client reported 1 positive of trying to positively reframe his depressive thoughts and also find some activities within reason that he can do that would not get his health at risk.  Suicidal/Homicidal: Nowithout intent/plan  Therapist Response:  Therapist began the appointment asking client how he has been doing since last seen. Therapist used CBT to engage using active listening and positive emotional support. Therapist used CBT to ask the client to find the source of his depressive thoughts and behaviors. Therapist used CBT to validate the clients feelings regarding grief and work. Therapist used CBT ask the client to identify his progress with frequency of use with coping skills with continued practice in his daily activity.    Therapist assigned the client homework to practice self care and  engaging in activities within means outside of his room.   Plan: Return again in 4 weeks.  Diagnosis: major depressive disorder, recurrent ,mild with anxious distress  Collaboration of Care: Patient refused AEB none requested by the client  Patient/Guardian was advised Release of Information must be obtained prior to any record release in  order to collaborate their care with an outside provider. Patient/Guardian was advised if they have not already done so to contact the registration department to sign all necessary forms in order for [place] to release information regarding their care.   Consent: Patient/Guardian gives verbal consent for treatment and assignment of benefits for services provided during this visit. Patient/Guardian expressed understanding and agreed to proceed.   Neena Rhymes Recie Cirrincione, LCSW 06/06/2023

## 2023-06-26 ENCOUNTER — Ambulatory Visit (INDEPENDENT_AMBULATORY_CARE_PROVIDER_SITE_OTHER): Payer: Medicaid Other | Admitting: Clinical

## 2023-06-26 DIAGNOSIS — F330 Major depressive disorder, recurrent, mild: Secondary | ICD-10-CM | POA: Diagnosis not present

## 2023-06-26 NOTE — Progress Notes (Signed)
THERAPIST PROGRESS NOTE Virtual Visit via Video Note  I connected with Matthew Barr on 06/26/2023 at  4:00 PM EST by a video enabled telemedicine application and verified that I am speaking with the correct person using two identifiers.  Location: Patient: home Provider: office   I discussed the limitations of evaluation and management by telemedicine and the availability of in person appointments. The patient expressed understanding and agreed to proceed.   Follow Up Instructions: I discussed the assessment and treatment plan with the patient. The patient was provided an opportunity to ask questions and all were answered. The patient agreed with the plan and demonstrated an understanding of the instructions.   The patient was advised to call back or seek an in-person evaluation if the symptoms worsen or if the condition fails to improve as anticipated.   Session Time: 45 minutes  Participation Level: Active  Behavioral Response: CasualAlertEuthymic  Type of Therapy: Individual Therapy  Treatment Goals addressed: Matthew Barr will practice behavioral activation skills 3 times per week for the next 12 weeks   ProgressTowards Goals: Progressing  Interventions: CBT and Supportive  Summary:  Matthew Barr is a 27 y.o. male who presents for the scheduled appointment oriented times five, appropriately dressed and friendly. Client denied hallucinations and delusions. Client reported he is happy that he has gone 2 weeks without having symptoms of a seizure. Client reported that is relieving. Client reported he tries his best to be optimistic but sometimes he does get down. Client reported he is feeling better since his dog passed away the day of his last therapy appointment. Client reported otherwise he has been thinking about Matthew Barr he can make for his family and friends. Client reported he likes to incorporate his sense of humor in their gifts. Client reported he had a  good thanksgiving with his family. Client reported he is looking forward to christmas. Client reported his brother surprised him with a early birthday gift of a gym membership. Client reported he is nervous but he try to do some things that aren't too strenuous. Client reported he misses martial arts and the activity he use to do before getting sick. Evidence of progress towards goal:  client reported 1 positive of engaging in positive activity.  Suicidal/Homicidal: Nowithout intent/plan  Therapist Response:  Therapist began the appointment asking the client how he has been doing. Therapist used cbt to engage using active listening and positive emotional support. Therapist used cbt to ask the client about positive things that have occurred. Therapist used cbt to positively acknowledge the clients perspective in attempts to find outlets that are not isolating. Therapist used CBT ask the client to identify her progress with frequency of use with coping skills with continued practice in her daily activity.    Therapist assigned the client homework to practice self care.    Plan: Return again in 4 weeks.  Diagnosis: major depressive disorder, recurrent episode, mild with anxious distress  Collaboration of Care: Patient refused AEB none requested by the client.  Patient/Guardian was advised Release of Information must be obtained prior to any record release in order to collaborate their care with an outside provider. Patient/Guardian was advised if they have not already done so to contact the registration department to sign all necessary forms in order for [place] to release information regarding their care.   Consent: Patient/Guardian gives verbal consent for treatment and assignment of benefits for services provided during this visit. Patient/Guardian expressed understanding and agreed to  proceed.   Matthew Rhymes Javayah Magaw, LCSW 06/26/2023

## 2023-06-27 ENCOUNTER — Ambulatory Visit (HOSPITAL_COMMUNITY): Payer: Medicaid Other | Admitting: Clinical

## 2023-07-30 ENCOUNTER — Ambulatory Visit (HOSPITAL_COMMUNITY): Payer: Medicaid Other | Admitting: Clinical

## 2023-07-30 DIAGNOSIS — F330 Major depressive disorder, recurrent, mild: Secondary | ICD-10-CM | POA: Diagnosis not present

## 2023-07-31 NOTE — Progress Notes (Signed)
   THERAPIST PROGRESS NOTE Virtual Visit via Video Note  I connected with [name] on 07/30/2023 at 11:00 AM EST by a video enabled telemedicine application and verified that I am speaking with the correct person using two identifiers.  Location: Patient: home Provider: office   I discussed the limitations of evaluation and management by telemedicine and the availability of in person appointments. The patient expressed understanding and agreed to proceed.   Follow Up Instructions: I discussed the assessment and treatment plan with the patient. The patient was provided an opportunity to ask questions and all were answered. The patient agreed with the plan and demonstrated an understanding of the instructions.   The patient was advised to call back or seek an in-person evaluation if the symptoms worsen or if the condition fails to improve as anticipated.   Session Time: 40 minutes  Participation Level: Active  Behavioral Response: CasualAlertEuthymic  Type of Therapy: Individual Therapy  Treatment Goals addressed: Matthew Barr will practice behavioral activation skills 3 times per week for the next 12 weeks   ProgressTowards Goals: Progressing  Interventions: CBT  Summary:  Matthew Barr is a 28 y.o. male who presents for the scheduled appointment oriented times five, appropriately dressed and friendly. Client denied hallucinations and delusions. Client reported he is doing well today. Client reported he had a good christmas holiday with his family. Client reported he was able to make some creative gifts for his family and friends. Client reported no major changes have occurred. Client reported last month was stressful for him because of episodes related to his seizures but now he is feeling better. Client reported he tries to do things outside of his room within means. Client reported he has upcoming appointments related to disability which he is anxious about. Evidence of progress  towards goal:  client reported 1 positive of staying engaged with family support and not isolating himself.  Suicidal/Homicidal: Nowithout intent/plan  Therapist Response:  Therapist began the appointment asking the client how he has been doing since last seen. Therapist used cbt to engage with active listening and positive emotional support. Therapist used cbt to engage and ask the client about how he coped during the holidays and current severity of his depressive symptoms. Therapist used cbt to positively reinforce refraing negative thoughts. Therapist used CBT ask the client to identify his progress with frequency of use with coping skills with continued practice in his daily activity.    Therapist assigned the client homework to practice self care.  Plan: Return again in 4 weeks.  Diagnosis: major depressive disorder, recurrent episode, mild with anxious distress  Collaboration of Care: Patient refused AEB none requested by the client.  Patient/Guardian was advised Release of Information must be obtained prior to any record release in order to collaborate their care with an outside provider. Patient/Guardian was advised if they have not already done so to contact the registration department to sign all necessary forms in order for us  to release information regarding their care.   Consent: Patient/Guardian gives verbal consent for treatment and assignment of benefits for services provided during this visit. Patient/Guardian expressed understanding and agreed to proceed.   Tomika Eckles Y Mumin Denomme, LCSW 07/30/2023

## 2023-09-03 ENCOUNTER — Ambulatory Visit (INDEPENDENT_AMBULATORY_CARE_PROVIDER_SITE_OTHER): Payer: Medicaid Other | Admitting: Clinical

## 2023-09-03 DIAGNOSIS — F330 Major depressive disorder, recurrent, mild: Secondary | ICD-10-CM

## 2023-09-05 NOTE — Progress Notes (Signed)
   THERAPIST PROGRESS NOTE Virtual Visit via Video Note  I connected with Matthew Barr on 09/03/2023 at 11:00 AM EST by a video enabled telemedicine application and verified that I am speaking with the correct person using two identifiers.  Location: Patient: home Provider: office   I discussed the limitations of evaluation and management by telemedicine and the availability of in person appointments. The patient expressed understanding and agreed to proceed.   Follow Up Instructions: I discussed the assessment and treatment plan with the patient. The patient was provided an opportunity to ask questions and all were answered. The patient agreed with the plan and demonstrated an understanding of the instructions.   The patient was advised to call back or seek an in-person evaluation if the symptoms worsen or if the condition fails to improve as anticipated.   Session Time: 45 minutes  Participation Level: Active  Behavioral Response: CasualAlertEuthymic  Type of Therapy: Individual Therapy  Treatment Goals addressed:  Matthew Barr will practice behavioral activation skills 3 times per week for the next 12 weeks   ProgressTowards Goals: Progressing  Interventions: CBT  Summary:  Matthew Barr is a 28 y.o. male who presents for the scheduled appointment oriented x 5, appropriately dressed, and friendly. Client denied hallucinations and delusions. Client reported on today he has ok but is sad. Client reported his maternal grandmother passed a few days ago. Client reported the whole family gathered at the hospital. Client reported the family is still processing the loss. Client reported he will be going with his mother to the funeral home to make arrangements soon. Client reported otherwise he has been distracting himself. Client discussed with the therapist fond memories of his grandmother and family. Client reported he has a in person court hearing for disability. Client reported he is  nervous. Evidence of progress towards goal:  client reported 1 positive of being positively engaged wit his family 7 days per week. Client reported behavioral activation of doing things within means at least 3 days per week.   Suicidal/Homicidal: Nowithout intent/plan  Therapist Response:  Therapist began the appointment asking the client how he has been doing since last seen. Therapist used cbt to engage with active listening and positive emotional support. Therapist used cbt to engage and give client time to discuss his stressors and how he is coping. Therapist used cbt to teach the client about reactions to grief and processing that. Therapist used CBT ask the client to identify his progress with frequency of use with coping skills with continued practice in his daily activity.    Therapist assigned the client homework to practice self care.  Plan: Return again in 4 weeks.  Diagnosis: mdd, mild with anxious distress  Collaboration of Care: Patient refused AEB none requested by the client.  Patient/Guardian was advised Release of Information must be obtained prior to any record release in order to collaborate their care with an outside provider. Patient/Guardian was advised if they have not already done so to contact the registration department to sign all necessary forms in order for [place] to release information regarding their care.   Consent: Patient/Guardian gives verbal consent for treatment and assignment of benefits for services provided during this visit. Patient/Guardian expressed understanding and agreed to proceed.   Matthew Barr Matthew Deterding, LCSW 09/03/2023

## 2023-10-03 ENCOUNTER — Encounter (HOSPITAL_COMMUNITY): Payer: Self-pay

## 2023-10-03 ENCOUNTER — Ambulatory Visit (HOSPITAL_COMMUNITY): Payer: Medicaid Other | Admitting: Clinical

## 2023-10-03 DIAGNOSIS — F320 Major depressive disorder, single episode, mild: Secondary | ICD-10-CM

## 2023-10-03 DIAGNOSIS — F330 Major depressive disorder, recurrent, mild: Secondary | ICD-10-CM

## 2023-10-03 NOTE — Progress Notes (Signed)
 THERAPIST PROGRESS NOTE Virtual Visit via Video Note  I connected with Matthew Barr on 10/03/23 at 11:00 AM EDT by a video enabled telemedicine application and verified that I am speaking with the correct person using two identifiers.  Location: Patient: home Provider: office   I discussed the limitations of evaluation and management by telemedicine and the availability of in person appointments. The patient expressed understanding and agreed to proceed.   Follow Up Instructions: I discussed the assessment and treatment plan with the patient. The patient was provided an opportunity to ask questions and all were answered. The patient agreed with the plan and demonstrated an understanding of the instructions.   The patient was advised to call back or seek an in-person evaluation if the symptoms worsen or if the condition fails to improve as anticipated.   Session Time: 45 minutes  Participation Level: Active  Behavioral Response: CasualAlertEuthymic  Type of Therapy: Individual Therapy  Treatment Goals addressed: Vong will practice behavioral activation skills 3 times per week for the next 12 weeks   ProgressTowards Goals: Progressing  Interventions: CBT  Summary:  Matthew Barr is a 28 y.o. male who presents for the scheduled appointment oriented x 5, appropriately dressed, and friendly.  Client denied hallucinations and delusions. Client reported on today he has been doing fairly well.  Client reported recently he and his family celebrated his late grandmother's birthday together.  Client reported it was a nice time but everyone is still emotional about her passing.  Client reported he has been recording the frequency of his previous symptoms to his seizure and actual seizures dates for his record for disability.  Client reported he has had symptoms every day which caused him to lay down.  Client reported he recently had a court date for his disability and it went very well.   Client reported he has to wait a little longer to see what their next determination will be.  Client reported things at home have been going okay and he and his family recently had a outing together which was nice.  Client reported is not often that he gets to go out and have a nice time.  Client reported he has been having some frustrations dealing with his dad told her that they get there is some things that he disagrees on him with.  Client reported otherwise he tries to keep himself in a positive mindset and not focus on irritants. Evidence of progress towards goal: Client reported 1 positive of engaging in some kind of behavioral activity within means 4 days out of the week.  Suicidal/Homicidal: Nowithout intent/plan  Therapist Response:  Therapist began the appointment asking the client how she has been doing since last seen. Therapist used CBT to engage with active listening and positive emotional support. Therapist used CBT to engage and give the client time to discuss his thoughts and feelings about his health, family, and legal process with disability. Therapist used CBT to normalize his thoughts and feelings within reason to irritants that have been reoccurring. Therapist used CBT to continue to teach the client about positive reframing of thoughts and self-care practices. Therapist used CBT ask the client to identify his progress with frequency of use with coping skills with continued practice in his daily activity.    Therapist assigned client homework to practice self-care.  Plan: Return again in 4 weeks.  Diagnosis: mdd, mild, with anxious distress  Collaboration of Care: Patient refused AEB none requested by the client.  Patient/Guardian was advised Release of Information must be obtained prior to any record release in order to collaborate their care with an outside provider. Patient/Guardian was advised if they have not already done so to contact the registration department to  sign all necessary forms in order for [place] to release information regarding their care.   Consent: Patient/Guardian gives verbal consent for treatment and assignment of benefits for services provided during this visit. Patient/Guardian expressed understanding and agreed to proceed.   Neena Rhymes Jamaia Brum, LCSW 10/03/2023

## 2023-10-08 DIAGNOSIS — G40009 Localization-related (focal) (partial) idiopathic epilepsy and epileptic syndromes with seizures of localized onset, not intractable, without status epilepticus: Secondary | ICD-10-CM | POA: Diagnosis not present

## 2023-10-24 ENCOUNTER — Ambulatory Visit (HOSPITAL_COMMUNITY): Payer: Medicaid Other | Admitting: Clinical

## 2023-10-24 ENCOUNTER — Encounter (HOSPITAL_COMMUNITY): Payer: Self-pay

## 2023-10-29 DIAGNOSIS — G40009 Localization-related (focal) (partial) idiopathic epilepsy and epileptic syndromes with seizures of localized onset, not intractable, without status epilepticus: Secondary | ICD-10-CM | POA: Diagnosis not present

## 2024-01-07 DIAGNOSIS — G40009 Localization-related (focal) (partial) idiopathic epilepsy and epileptic syndromes with seizures of localized onset, not intractable, without status epilepticus: Secondary | ICD-10-CM | POA: Diagnosis not present

## 2024-01-30 DIAGNOSIS — G40109 Localization-related (focal) (partial) symptomatic epilepsy and epileptic syndromes with simple partial seizures, not intractable, without status epilepticus: Secondary | ICD-10-CM | POA: Diagnosis not present
